# Patient Record
Sex: Male | Born: 1960 | Race: White | Hispanic: No | Smoking: Never smoker
Health system: Southern US, Community
[De-identification: ages and names within clinical notes are randomized; demographics above are authoritative.]

## PROBLEM LIST (undated history)

## (undated) DIAGNOSIS — E785 Hyperlipidemia, unspecified: Secondary | ICD-10-CM

## (undated) DIAGNOSIS — Z87442 Personal history of urinary calculi: Secondary | ICD-10-CM

## (undated) DIAGNOSIS — T39395A Adverse effect of other nonsteroidal anti-inflammatory drugs [NSAID], initial encounter: Secondary | ICD-10-CM

## (undated) DIAGNOSIS — R002 Palpitations: Secondary | ICD-10-CM

## (undated) DIAGNOSIS — IMO0002 Reserved for concepts with insufficient information to code with codable children: Secondary | ICD-10-CM

## (undated) DIAGNOSIS — M545 Low back pain, unspecified: Secondary | ICD-10-CM

## (undated) DIAGNOSIS — I10 Essential (primary) hypertension: Secondary | ICD-10-CM

## (undated) DIAGNOSIS — J45909 Unspecified asthma, uncomplicated: Secondary | ICD-10-CM

## (undated) DIAGNOSIS — Q059 Spina bifida, unspecified: Secondary | ICD-10-CM

## (undated) DIAGNOSIS — G4733 Obstructive sleep apnea (adult) (pediatric): Secondary | ICD-10-CM

## (undated) DIAGNOSIS — J189 Pneumonia, unspecified organism: Secondary | ICD-10-CM

## (undated) DIAGNOSIS — I4510 Unspecified right bundle-branch block: Secondary | ICD-10-CM

## (undated) DIAGNOSIS — K922 Gastrointestinal hemorrhage, unspecified: Secondary | ICD-10-CM

## (undated) DIAGNOSIS — E669 Obesity, unspecified: Secondary | ICD-10-CM

## (undated) DIAGNOSIS — R0602 Shortness of breath: Secondary | ICD-10-CM

## (undated) DIAGNOSIS — K254 Chronic or unspecified gastric ulcer with hemorrhage: Secondary | ICD-10-CM

## (undated) DIAGNOSIS — R9431 Abnormal electrocardiogram [ECG] [EKG]: Secondary | ICD-10-CM

## (undated) DIAGNOSIS — Z0189 Encounter for other specified special examinations: Secondary | ICD-10-CM

## (undated) HISTORY — PX: EXTRACORPOREAL SHOCK WAVE LITHOTRIPSY: SHX1557

## (undated) HISTORY — DX: Encounter for other specified special examinations: Z01.89

## (undated) HISTORY — PX: EYE SURGERY: SHX253

## (undated) HISTORY — DX: Shortness of breath: R06.02

## (undated) HISTORY — DX: Palpitations: R00.2

## (undated) HISTORY — DX: Unspecified asthma, uncomplicated: J45.909

## (undated) HISTORY — DX: Chronic or unspecified gastric ulcer with hemorrhage: K25.4

## (undated) HISTORY — DX: Unspecified right bundle-branch block: I45.10

## (undated) HISTORY — DX: Hyperlipidemia, unspecified: E78.5

---

## 1983-09-20 HISTORY — PX: HERNIA REPAIR: SHX51

## 1997-09-25 HISTORY — PX: HERNIA REPAIR: SHX51

## 1999-04-12 ENCOUNTER — Encounter: Payer: Self-pay | Admitting: *Deleted

## 1999-04-12 ENCOUNTER — Ambulatory Visit (HOSPITAL_COMMUNITY): Admission: RE | Admit: 1999-04-12 | Discharge: 1999-04-12 | Payer: Self-pay | Admitting: Pediatrics

## 1999-05-25 ENCOUNTER — Ambulatory Visit (HOSPITAL_COMMUNITY): Admission: RE | Admit: 1999-05-25 | Discharge: 1999-05-25 | Payer: Self-pay | Admitting: *Deleted

## 1999-05-25 ENCOUNTER — Encounter: Payer: Self-pay | Admitting: *Deleted

## 2001-08-08 ENCOUNTER — Encounter: Admission: RE | Admit: 2001-08-08 | Discharge: 2001-08-08 | Payer: Self-pay | Admitting: Pediatrics

## 2001-08-08 ENCOUNTER — Encounter: Payer: Self-pay | Admitting: *Deleted

## 2001-08-12 ENCOUNTER — Encounter: Payer: Self-pay | Admitting: *Deleted

## 2001-08-12 ENCOUNTER — Encounter: Admission: RE | Admit: 2001-08-12 | Discharge: 2001-08-12 | Payer: Self-pay | Admitting: *Deleted

## 2001-08-29 ENCOUNTER — Encounter: Payer: Self-pay | Admitting: *Deleted

## 2001-08-29 ENCOUNTER — Encounter: Admission: RE | Admit: 2001-08-29 | Discharge: 2001-08-29 | Payer: Self-pay | Admitting: *Deleted

## 2002-09-09 ENCOUNTER — Ambulatory Visit (HOSPITAL_COMMUNITY): Admission: RE | Admit: 2002-09-09 | Discharge: 2002-09-09 | Payer: Self-pay | Admitting: Pediatrics

## 2002-09-09 ENCOUNTER — Encounter: Admission: RE | Admit: 2002-09-09 | Discharge: 2002-09-09 | Payer: Self-pay | Admitting: *Deleted

## 2002-09-09 ENCOUNTER — Encounter: Payer: Self-pay | Admitting: *Deleted

## 2002-09-11 ENCOUNTER — Ambulatory Visit (HOSPITAL_BASED_OUTPATIENT_CLINIC_OR_DEPARTMENT_OTHER): Admission: RE | Admit: 2002-09-11 | Discharge: 2002-09-11 | Payer: Self-pay | Admitting: *Deleted

## 2002-09-11 ENCOUNTER — Encounter: Payer: Self-pay | Admitting: *Deleted

## 2002-10-23 ENCOUNTER — Ambulatory Visit (HOSPITAL_BASED_OUTPATIENT_CLINIC_OR_DEPARTMENT_OTHER): Admission: RE | Admit: 2002-10-23 | Discharge: 2002-10-23 | Payer: Self-pay | Admitting: *Deleted

## 2002-10-23 ENCOUNTER — Encounter: Payer: Self-pay | Admitting: *Deleted

## 2002-11-03 ENCOUNTER — Encounter: Payer: Self-pay | Admitting: *Deleted

## 2002-11-03 ENCOUNTER — Encounter: Admission: RE | Admit: 2002-11-03 | Discharge: 2002-11-03 | Payer: Self-pay | Admitting: *Deleted

## 2005-10-03 ENCOUNTER — Encounter: Admission: RE | Admit: 2005-10-03 | Discharge: 2005-10-03 | Payer: Self-pay | Admitting: Specialist

## 2007-02-20 ENCOUNTER — Ambulatory Visit: Payer: Self-pay | Admitting: Urology

## 2007-02-21 ENCOUNTER — Ambulatory Visit: Payer: Self-pay | Admitting: Urology

## 2007-02-28 ENCOUNTER — Ambulatory Visit: Payer: Self-pay | Admitting: Urology

## 2007-03-07 ENCOUNTER — Ambulatory Visit: Payer: Self-pay | Admitting: Urology

## 2007-03-12 ENCOUNTER — Ambulatory Visit: Payer: Self-pay | Admitting: Urology

## 2007-08-05 ENCOUNTER — Ambulatory Visit: Payer: Self-pay | Admitting: Urology

## 2007-08-29 ENCOUNTER — Ambulatory Visit: Payer: Self-pay | Admitting: Urology

## 2007-09-05 ENCOUNTER — Ambulatory Visit: Payer: Self-pay | Admitting: Urology

## 2007-09-18 ENCOUNTER — Ambulatory Visit: Payer: Self-pay | Admitting: Urology

## 2007-11-18 ENCOUNTER — Ambulatory Visit: Payer: Self-pay | Admitting: Urology

## 2008-10-12 ENCOUNTER — Ambulatory Visit: Payer: Self-pay | Admitting: Urology

## 2009-01-14 IMAGING — CR DG ABDOMEN 1V
1 series · 2 of 2 positions shown · non-contrast
Comparison: none

REASON FOR EXAM: renal calculi-lithotripsy
COMMENTS:

[Series 1: view not recorded · 0.17mm/px · 2 of 2 slices shown]
[im 1/2]
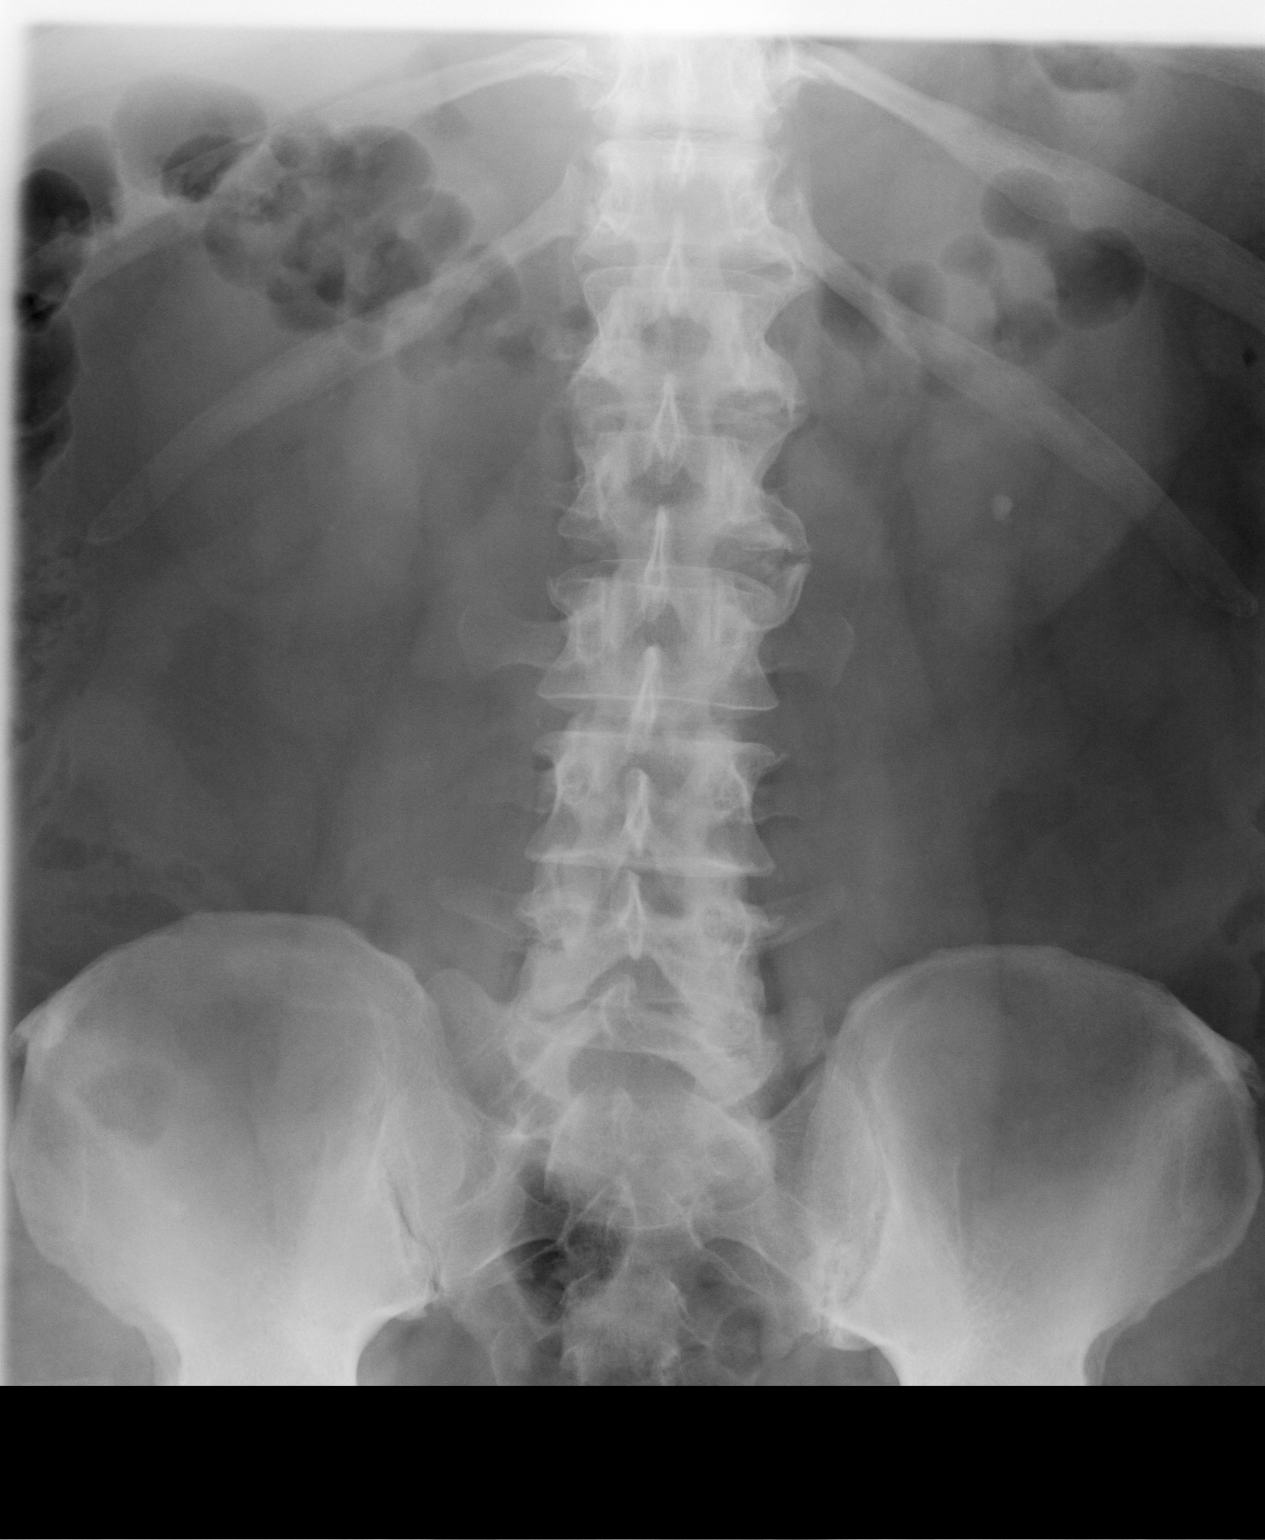
[im 2/2]
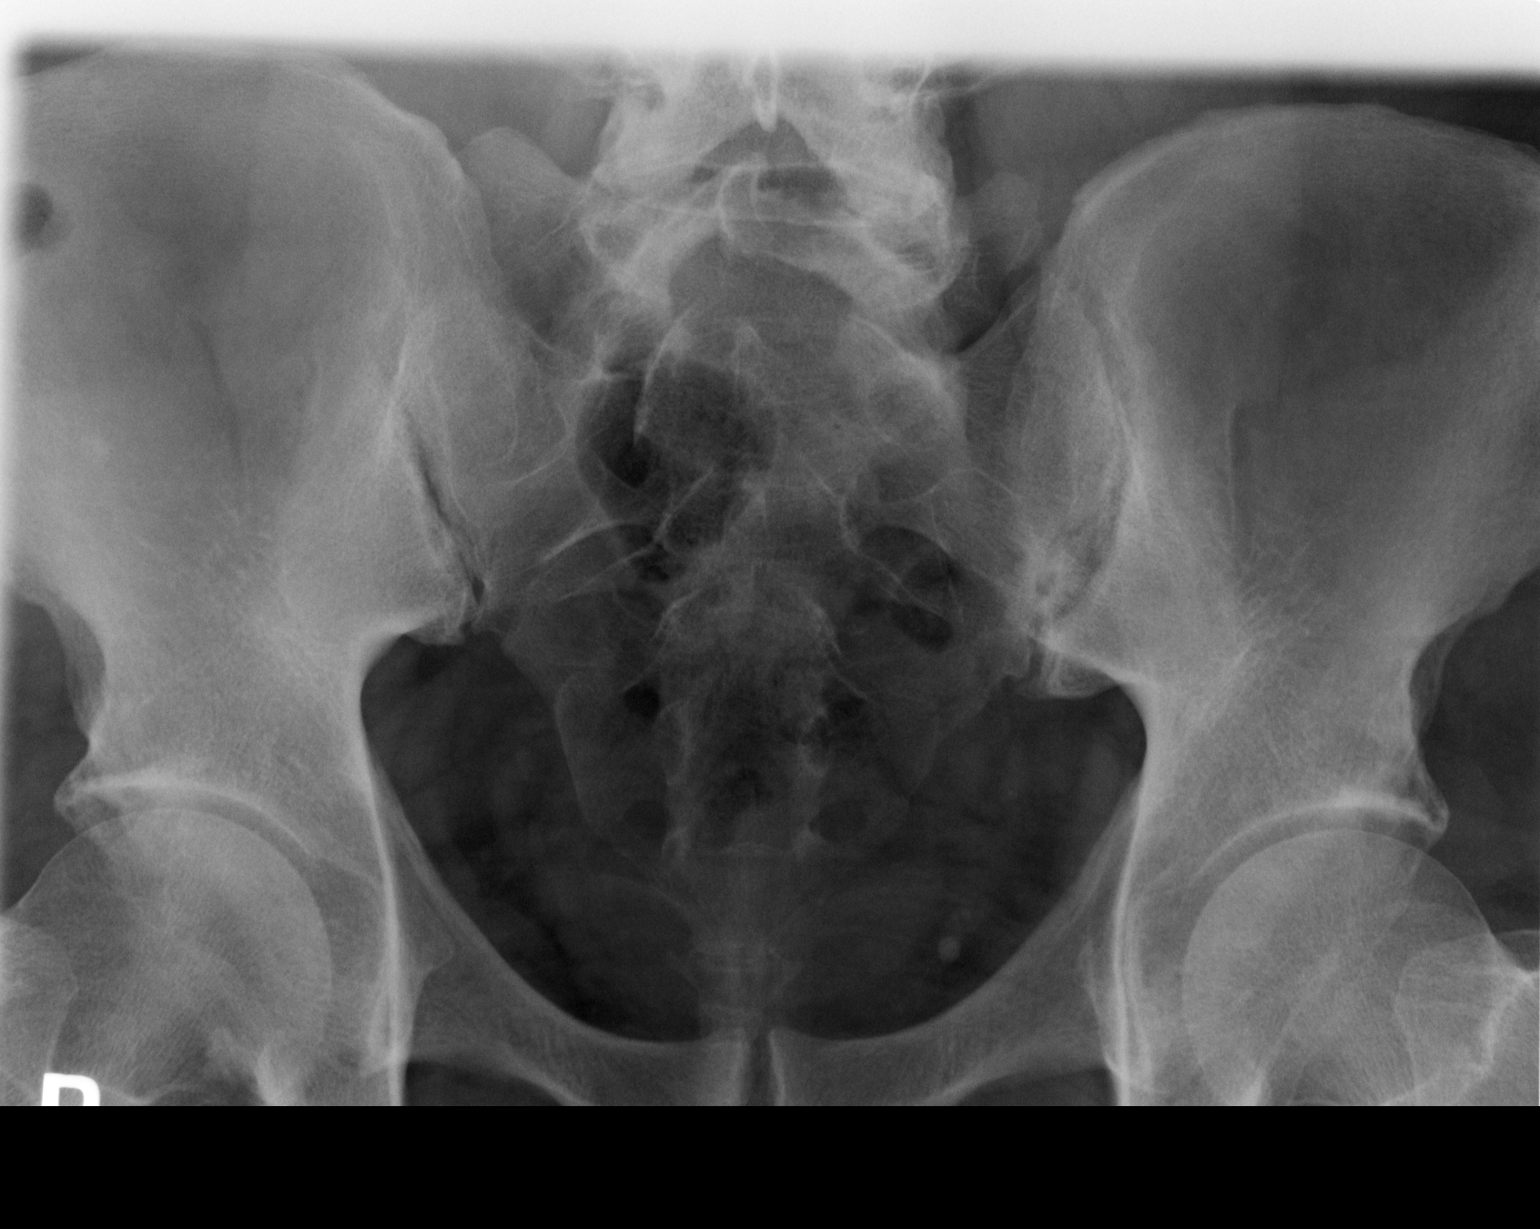

[2 of 2 positions shown; findings below may reference images not displayed]

PROCEDURE:     DXR - DXR KIDNEY URETER BLADDER  - September 05, 2007  [DATE]

RESULT:     An AP view of the abdomen shows an 8 mm calcification projected
over the lower pole of the LEFT kidney. The tiny RIGHT renal stones noted at
prior CT are not definitely identified on plain film examination. No
definite ureteral stones are seen.
IMPRESSION: 1.     Please see above.

## 2009-07-22 ENCOUNTER — Ambulatory Visit: Payer: Self-pay | Admitting: Urology

## 2009-07-27 ENCOUNTER — Ambulatory Visit: Payer: Self-pay | Admitting: Urology

## 2010-08-10 ENCOUNTER — Encounter: Admission: RE | Admit: 2010-08-10 | Discharge: 2010-08-10 | Payer: Self-pay | Admitting: Orthopedic Surgery

## 2011-01-20 HISTORY — PX: KNEE ARTHROSCOPY: SHX127

## 2011-05-05 NOTE — H&P (Signed)
NAME:  Leslie Soto, Leslie Soto NO.:  [id number]   MEDICAL RECORD NO.:  [id number]                   PATIENT TYPE:  AMB   LOCATION:  [facility]                                 FACILITY:  [facility]   PHYSICIAN:  Ethelene Browns., M.D.            DATE OF BIRTH:  [birth year]   DATE OF ADMISSION:  10/23/2002  DATE OF DISCHARGE:                                HISTORY & PHYSICAL   INTRODUCTION:  This 50 year old male is scheduled for a left renal ESWL on  October 23, 2002, [facility], 3:30 p.m., with a chief complaint  of pain in the left side.   HISTORY OF PRESENT ILLNESS:  This 50 year old male has had renal calculi  bilaterally since 1977/05/24, onset age 28.  In 05/24/88, he had left ESWL, and he  claims that he has passed over 30 calculi over the years.  He does have  several documented calculi, the last of which was a left ureteral calculus  that was passed September 12, 2002.  He has been evaluated for parathyroid  disease, and his calciums and parathyroid hormone have been normal.  He did  have some blood in the urine when he passed the stone in September.  He gets  up twice a night and voids every couple of hours.  He has had a KUB, which  showed a stone about 10 mm in the left lower calix, and he would like to  have this fragmented in an attempt to improve the discomfort in his left  side.   ALLERGIES:  No known drug allergies.   CURRENT MEDICATIONS:  1. Hyzaar 12.5 mg daily.  2. Percocet p.r.n. for pain.   PAST MEDICAL HISTORY:  1. Eye surgery in 05/24/1968.  2. Hernia repair in 1987 and 1986.  3. ESWL on the left in 24-May-1988.  4. He has passed numerous stones.  5. Treated for hypertension.   FAMILY HISTORY:  Positive for heart disease, kidney disease, high blood  pressure, diabetes, and stones.   SOCIAL HISTORY:  The patient is single.  He does not abuse alcohol or  tobacco.  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   REVIEW OF SYSTEMS:  General  health has been good.  Weight has been stable.  HEENT:  Unremarkable.  CARDIORESPIRATORY:  He denies any chest pain or heart  attack.  GASTROINTESTINAL:  No peptic ulcer disease.  No hepatitis.  He has  had some hemorrhoid problems.  No recent bleeding.  BONES, JOINTS, MUSCLES:  He denies arthritis.  NEUROPSYCHIATRIC:  He denies any strokes or fainting  spells.  SKIN:  No lesions.  HEMATOPOIETIC AND LYMPHATIC:  No nodes or  anemia.   PHYSICAL EXAMINATION:  GENERAL:  Well-developed, well-nourished 50 year old  male.  VITAL SIGNS:  Temperature 97, pulse 70, respirations 17, blood pressure  168/104.  HEENT:  The ears and tympanic membranes are unremarkable.  Eyes  react  normally to light and accommodation.  Extraocular muscles intact.  Pharynx  benign.  Teeth in good repair.  NECK:  No enlargement of thyroid.  No nodes palpable.  CHEST:  Clear to percussion and auscultation.  HEART:  Normal sinus rhythm.  No murmur.  ABDOMEN:  Moderately obese.  There is a small umbilical scar, right inguinal  scar.  Liver, kidney, spleen, masses, tenderness, hernia are not detected.  GENITOURINARY:  External genitalia and meatus are normal.  The penis is  circumcised.  Testes good size, symmetrical.  RECTAL:  Scrotum, anus, perineum, and rectal tone good.  Prostate estimated  20-25 g, smooth, soft, symmetrical, nontender.  EXTREMITIES:  No edema.  Good pulses.  NEUROLOGIC:  Grossly normal reflexes and sensation.   IMPRESSION:  1. Left renal calcification, lower calix, 10 mm.  2. Bilateral renal calculi dating to 1978, with extracorporeal shockwave     lithotripsy on the left in 1989, and over 30 calculi passed by history.  3. Hypertension.   PLAN:  We will proceed with ESWL on the left on October 23, 2002.                                                Ethelene Browns., M.D.    HB/MEDQ  D:  10/22/2002  T:  10/22/2002  Job:  [id number]

## 2011-05-05 NOTE — H&P (Signed)
NAME:  Leslie Soto, Leslie Soto NO.:  [id number]   MEDICAL RECORD NO.:  [id number]                    PATIENT TYPE:   LOCATION:                                       FACILITY:   PHYSICIAN:  Lynnea Ferrier., M.D.         DATE OF BIRTH:  08-26-1961   DATE OF ADMISSION:  DATE OF DISCHARGE:                                HISTORY & PHYSICAL   REASON FOR ADMISSION:  This 50 year old male is scheduled on September 11, 2002, for ESWL of a left ureteral calculus L2-3 level.   CHIEF COMPLAINT:  Pain in the left side.   HISTORY OF PRESENT ILLNESS:  This 50 year old male has had renal calculi  bilaterally since 1978, onset age 40.  In 1989, he had left ESWL and he  claims that he has passed over 30 calculi over the years.  He does have  several documented calculi, the last of which was a right ureteral calculus  passed in April of 2003 and it was calcium oxylate monohydrate on analysis.  The patient has also had parathyroid and calcium phosphorus determinations  which have been within normal limits November 2002.  He has had some blood  in the urine.  He has had pain for two weeks.  He gets up twice a night,  voids every two hours.  He had a KUB on September 04, 2002, that showed a  stone at the L2-3 level on the left.  He also has a 10 mm calculus in the  left lower calix.  The one in the ureter measures about 3 x 5 mm.  In view  of the persistent pain, the patient was advised to have shockwave  lithotripsy, advised to stop his aspirin on September 04, 2002, scheduled  for September 11, 2002.   ALLERGIES:  No known drug allergies.   CURRENT MEDICATIONS:  1. Hyzaar 12.5 mg daily.  2. He has Percocet if he needs it for pain.   PAST MEDICAL HISTORY:  He has had eye surgery in 1969.  He had hernia  surgery in 1987 and 1986.  He had ESWL on the left as noted on in 1989.  He  has also been treated for hypertension.   FAMILY HISTORY:  This is positive for heart  disease, kidney disease, high  blood pressure, diabetes and stones.   SOCIAL HISTORY:  The patient is single.  He does not abuse alcohol or  tobacco.  He is employed.   REVIEW OF SYSTEMS:  GENERAL:  Health has been good.  Weight has been stable.  HEENT:        Unremarkable.  CARDIORESPIRATORY:  Denies any chest pain or  heart attack.  GI:  No peptic ulcer disease, no hepatitis.  He has had some  hemorrhoid problems.  BONES, JOINTS, MUSCLES:  He denies arthritis.  NEUROPSYCHIATRIC:  He denies any strokes.  SKIN:  No skin lesions.  HEMATOPOIETIC AND LYMPHATIC:  No nodes or anemia.   PHYSICAL EXAMINATION:  GENERAL APPEARANCE:  A well-developed, well-nourished  50 year old male with discomfort in the left side.  VITAL SIGNS:  Temperature 98.0, pulse 67, respiratory rate 18, blood  pressure 151/98.  Weight is 220.  HEENT:  The ears and tympanic membranes are unremarkable.  Eyes react to  light and  accommodation.  Extraocular movements intact.  Pharynx benign.  Teeth in good repair.  NECK:  No enlargement of thyroid or nodes palpable.  CHEST:  Clear to percussion and auscultation.  CARDIOVASCULAR:  Normal sinus rhythm with no murmur.  ABDOMEN:  The abdomen is moderately obese.  He had a small umbilical scar,  right inguinal scar.  Liver, kidney, spleen, masses, tenderness, hernia not  demonstrated today.  GU:  External genitalia and meatus are normal.  Penis is circumcised.  Testes are good size and symmetrical.  Scrotum, anus, perineum normal.  Rectal tone good.  Prostate is estimated at 20 to 25 g, smooth, soft,  symmetrical, nontender.  EXTREMITIES:  No edema.  Good peripheral pulses.  NEUROLOGIC:  Grossly normal reflexes and sensation.   IMPRESSION:  1. Left ureteral calculus 3 x 5 mm at L2-3 level.  2. Bilateral renal calculi dating to 10.  3. Hypertension.  4. History of extracorporeal shock wave lithotripsy in 1989 left.   PLAN:  We will proceed with ESWL on the left on  September 11, 2002, if his  stone does not pass.                                                  Lynnea Ferrier., M.D.    Willia Craze  D:  09/04/2002  T:  09/04/2002  Job:  [id number]

## 2012-02-01 ENCOUNTER — Ambulatory Visit: Payer: Self-pay | Admitting: Urology

## 2012-02-08 ENCOUNTER — Ambulatory Visit: Payer: Self-pay | Admitting: Urology

## 2012-02-22 ENCOUNTER — Ambulatory Visit: Payer: Self-pay | Admitting: Urology

## 2012-06-12 HISTORY — PX: CARDIOVASCULAR STRESS TEST: SHX262

## 2012-07-25 ENCOUNTER — Ambulatory Visit: Payer: Self-pay | Admitting: Urology

## 2012-07-29 ENCOUNTER — Ambulatory Visit: Payer: Self-pay | Admitting: Urology

## 2012-08-26 ENCOUNTER — Encounter (HOSPITAL_COMMUNITY): Payer: Self-pay | Admitting: *Deleted

## 2012-08-26 DIAGNOSIS — IMO0002 Reserved for concepts with insufficient information to code with codable children: Secondary | ICD-10-CM | POA: Diagnosis present

## 2012-08-26 DIAGNOSIS — I498 Other specified cardiac arrhythmias: Secondary | ICD-10-CM | POA: Diagnosis present

## 2012-08-26 DIAGNOSIS — I10 Essential (primary) hypertension: Secondary | ICD-10-CM | POA: Diagnosis present

## 2012-08-26 DIAGNOSIS — M5137 Other intervertebral disc degeneration, lumbosacral region: Secondary | ICD-10-CM | POA: Diagnosis present

## 2012-08-26 DIAGNOSIS — M1710 Unilateral primary osteoarthritis, unspecified knee: Secondary | ICD-10-CM | POA: Diagnosis present

## 2012-08-26 DIAGNOSIS — D72829 Elevated white blood cell count, unspecified: Secondary | ICD-10-CM | POA: Diagnosis present

## 2012-08-26 DIAGNOSIS — T3995XA Adverse effect of unspecified nonopioid analgesic, antipyretic and antirheumatic, initial encounter: Secondary | ICD-10-CM | POA: Diagnosis present

## 2012-08-26 DIAGNOSIS — M51379 Other intervertebral disc degeneration, lumbosacral region without mention of lumbar back pain or lower extremity pain: Secondary | ICD-10-CM | POA: Diagnosis present

## 2012-08-26 DIAGNOSIS — G4733 Obstructive sleep apnea (adult) (pediatric): Secondary | ICD-10-CM | POA: Diagnosis present

## 2012-08-26 DIAGNOSIS — E669 Obesity, unspecified: Secondary | ICD-10-CM | POA: Diagnosis present

## 2012-08-26 DIAGNOSIS — M545 Low back pain, unspecified: Secondary | ICD-10-CM | POA: Diagnosis present

## 2012-08-26 DIAGNOSIS — K254 Chronic or unspecified gastric ulcer with hemorrhage: Principal | ICD-10-CM | POA: Diagnosis present

## 2012-08-26 LAB — URINALYSIS, ROUTINE W REFLEX MICROSCOPIC
Bilirubin Urine: NEGATIVE
Glucose, UA: NEGATIVE mg/dL
Hgb urine dipstick: NEGATIVE
Ketones, ur: 15 mg/dL — AB
Leukocytes, UA: NEGATIVE
Protein, ur: NEGATIVE mg/dL
Specific Gravity, Urine: 1.027 (ref 1.005–1.030)
Urobilinogen, UA: 0.2 mg/dL (ref 0.0–1.0)
pH: 7 (ref 5.0–8.0)

## 2012-08-26 LAB — COMPREHENSIVE METABOLIC PANEL
ALT: 19 U/L (ref 0–53)
Albumin: 3.6 g/dL (ref 3.5–5.2)
Alkaline Phosphatase: 74 U/L (ref 39–117)
BUN: 36 mg/dL — ABNORMAL HIGH (ref 6–23)
Calcium: 9.8 mg/dL (ref 8.4–10.5)
Chloride: 109 mEq/L (ref 96–112)
Creatinine, Ser: 0.77 mg/dL (ref 0.50–1.35)
GFR calc non Af Amer: >90 mL/min (ref >90)
Sodium: 141 mEq/L (ref 135–145)
Total Bilirubin: 0.7 mg/dL (ref 0.3–1.2)

## 2012-08-26 LAB — CBC WITH DIFFERENTIAL/PLATELET
Basophils Absolute: 0 10*3/uL (ref 0.0–0.1)
Basophils Relative: 0 % (ref 0–1)
Eosinophils Relative: 1 % (ref 0–5)
HCT: 40.1 % (ref 39.0–52.0)
Hemoglobin: 13.7 g/dL (ref 13.0–17.0)
Lymphs Abs: 2.7 10*3/uL (ref 0.7–4.0)
MCH: 31.4 pg (ref 26.0–34.0)
MCHC: 34.2 g/dL (ref 30.0–36.0)
MCV: 91.8 fL (ref 78.0–100.0)
Monocytes Absolute: 1.1 10*3/uL — ABNORMAL HIGH (ref 0.1–1.0)
Monocytes Relative: 7 % (ref 3–12)
Neutro Abs: 10.5 10*3/uL — ABNORMAL HIGH (ref 1.7–7.7)
Neutrophils Relative %: 73 % (ref 43–77)
Platelets: 233 10*3/uL (ref 150–400)
RBC: 4.37 MIL/uL (ref 4.22–5.81)
RDW: 14.5 % (ref 11.5–15.5)
WBC: 14.4 10*3/uL — ABNORMAL HIGH (ref 4.0–10.5)

## 2012-08-26 LAB — SAMPLE TO BLOOD BANK

## 2012-08-26 NOTE — ED Notes (Signed)
BM visualized in commode: no obvious blood, appears soft, loose, no form visualized, black & brown.

## 2012-08-26 NOTE — ED Notes (Signed)
Black stools since this am with frequent stools and with each stool he gets dizzy. nauseated

## 2012-08-26 NOTE — ED Notes (Addendum)
Pt pleasantly sarcastic, playful, joking and minimizing of sx, being polite & chivalrous in triage area, difficult to ascertain pt's sx severity. Ambulatory to b/r.

## 2012-08-27 ENCOUNTER — Encounter (HOSPITAL_COMMUNITY): Payer: Self-pay | Admitting: Family Medicine

## 2012-08-27 ENCOUNTER — Inpatient Hospital Stay (HOSPITAL_COMMUNITY)
Admission: EM | Admit: 2012-08-27 | Discharge: 2012-08-29 | DRG: 175 | Disposition: A | Discharge status: Home / Self Care | Payer: BC Managed Care – PPO | Attending: Internal Medicine | Admitting: Internal Medicine

## 2012-08-27 ENCOUNTER — Emergency Department (HOSPITAL_COMMUNITY): Payer: BC Managed Care – PPO

## 2012-08-27 DIAGNOSIS — M545 Low back pain, unspecified: Secondary | ICD-10-CM | POA: Diagnosis present

## 2012-08-27 DIAGNOSIS — IMO0002 Reserved for concepts with insufficient information to code with codable children: Secondary | ICD-10-CM | POA: Diagnosis present

## 2012-08-27 DIAGNOSIS — K254 Chronic or unspecified gastric ulcer with hemorrhage: Secondary | ICD-10-CM

## 2012-08-27 DIAGNOSIS — G4733 Obstructive sleep apnea (adult) (pediatric): Secondary | ICD-10-CM | POA: Diagnosis present

## 2012-08-27 DIAGNOSIS — T39395A Adverse effect of other nonsteroidal anti-inflammatory drugs [NSAID], initial encounter: Secondary | ICD-10-CM | POA: Diagnosis present

## 2012-08-27 DIAGNOSIS — K921 Melena: Secondary | ICD-10-CM | POA: Diagnosis present

## 2012-08-27 DIAGNOSIS — R000 Tachycardia, unspecified: Secondary | ICD-10-CM | POA: Diagnosis present

## 2012-08-27 DIAGNOSIS — T887XXA Unspecified adverse effect of drug or medicament, initial encounter: Secondary | ICD-10-CM

## 2012-08-27 DIAGNOSIS — D62 Acute posthemorrhagic anemia: Secondary | ICD-10-CM | POA: Diagnosis present

## 2012-08-27 DIAGNOSIS — K922 Gastrointestinal hemorrhage, unspecified: Secondary | ICD-10-CM | POA: Diagnosis present

## 2012-08-27 DIAGNOSIS — E669 Obesity, unspecified: Secondary | ICD-10-CM | POA: Diagnosis present

## 2012-08-27 DIAGNOSIS — I10 Essential (primary) hypertension: Secondary | ICD-10-CM | POA: Diagnosis present

## 2012-08-27 DIAGNOSIS — D72829 Elevated white blood cell count, unspecified: Secondary | ICD-10-CM | POA: Diagnosis present

## 2012-08-27 HISTORY — DX: Low back pain, unspecified: M54.50

## 2012-08-27 HISTORY — DX: Adverse effect of other nonsteroidal anti-inflammatory drugs (NSAID), initial encounter: T39.395A

## 2012-08-27 HISTORY — DX: Reserved for concepts with insufficient information to code with codable children: IMO0002

## 2012-08-27 HISTORY — DX: Obesity, unspecified: E66.9

## 2012-08-27 HISTORY — DX: Gastrointestinal hemorrhage, unspecified: K92.2

## 2012-08-27 HISTORY — DX: Essential (primary) hypertension: I10

## 2012-08-27 HISTORY — DX: Spina bifida, unspecified: Q05.9

## 2012-08-27 HISTORY — DX: Obstructive sleep apnea (adult) (pediatric): G47.33

## 2012-08-27 HISTORY — DX: Low back pain: M54.5

## 2012-08-27 LAB — CBC
HCT: 34.2 % — ABNORMAL LOW (ref 39.0–52.0)
Hemoglobin: 11.8 g/dL — ABNORMAL LOW (ref 13.0–17.0)
MCH: 31.2 pg (ref 26.0–34.0)
MCHC: 34.5 g/dL (ref 30.0–36.0)
MCV: 90.5 fL (ref 78.0–100.0)
MCV: 91.4 fL (ref 78.0–100.0)
Platelets: 217 K/uL (ref 150–400)
Platelets: 178 10*3/uL (ref 150–400)
RBC: 3.78 MIL/uL — ABNORMAL LOW (ref 4.22–5.81)
RBC: 3.26 MIL/uL — ABNORMAL LOW (ref 4.22–5.81)
RDW: 14.5 % (ref 11.5–15.5)
WBC: 14.7 K/uL — ABNORMAL HIGH (ref 4.0–10.5)
WBC: 10.9 10*3/uL — ABNORMAL HIGH (ref 4.0–10.5)

## 2012-08-27 LAB — TYPE AND SCREEN
ABO/RH(D): O POS
Antibody Screen: NEGATIVE

## 2012-08-27 LAB — OCCULT BLOOD, POC DEVICE: Fecal Occult Bld: POSITIVE

## 2012-08-27 LAB — ABO/RH: ABO/RH(D): O POS

## 2012-08-27 LAB — HEMOGLOBIN AND HEMATOCRIT, BLOOD: HCT: 32.5 % — ABNORMAL LOW (ref 39.0–52.0)

## 2012-08-27 MED ORDER — SODIUM CHLORIDE 0.9 % IJ SOLN
3.0000 mL | Freq: Two times a day (BID) | INTRAMUSCULAR | Status: DC
  Administered 2012-08-27 – 2012-08-29 (×3): 3 mL via INTRAVENOUS

## 2012-08-27 MED ORDER — HYDRALAZINE HCL 20 MG/ML IJ SOLN
5.0000 mg | Freq: Four times a day (QID) | INTRAMUSCULAR | Status: DC | PRN
  Filled 2012-08-27: qty 0.25

## 2012-08-27 MED ORDER — SODIUM CHLORIDE 0.9 % IV BOLUS (SEPSIS)
1000.0000 mL | Freq: Once | INTRAVENOUS | Status: AC
Start: 2012-08-27 — End: 2012-08-27
  Administered 2012-08-27: 1000 mL via INTRAVENOUS

## 2012-08-27 MED ORDER — SODIUM CHLORIDE 0.9 % IV SOLN
8.0000 mg/h | Freq: Once | INTRAVENOUS | Status: AC
  Administered 2012-08-27: 8 mg/h via INTRAVENOUS
  Filled 2012-08-27: qty 80

## 2012-08-27 MED ORDER — PANTOPRAZOLE SODIUM 40 MG IV SOLR
40.0000 mg | Freq: Two times a day (BID) | INTRAVENOUS | Status: DC
  Administered 2012-08-27 – 2012-08-29 (×4): 40 mg via INTRAVENOUS
  Filled 2012-08-27 (×6): qty 40

## 2012-08-27 MED ORDER — SODIUM CHLORIDE 0.9 % IV SOLN
INTRAVENOUS | Status: DC
  Administered 2012-08-27 – 2012-08-28 (×5): via INTRAVENOUS

## 2012-08-27 MED ORDER — PANTOPRAZOLE SODIUM 40 MG IV SOLR
40.0000 mg | Freq: Once | INTRAVENOUS | Status: AC
  Administered 2012-08-27: 40 mg via INTRAVENOUS
  Filled 2012-08-27: qty 40

## 2012-08-27 MED ORDER — MORPHINE SULFATE 2 MG/ML IJ SOLN: 1.0000 mg | INTRAMUSCULAR | Status: DC | PRN

## 2012-08-27 NOTE — ED Provider Notes (Signed)
History     CSN: [id number]  Arrival date & time 08/26/12  1929   First MD Initiated Contact with Patient 08/27/12 0109      Chief Complaint  Patient presents with  . black stools     (Consider location/radiation/quality/duration/timing/severity/associated sxs/prior treatment) HPI HX per PT.  takes NSAIDs and ASA daily for chronic back pain and tonight noticed black stools, no BRBPR, no ABD pain or N/V/D. Has not taken BP meds in 3 days. Mod in severity, no h/o same. No blood thinners otherwise. No etoh. Multiple black stools tonight. No diarrhea.  Past Medical History  Diagnosis Date  . Hypertension     History reviewed. No pertinent past surgical history.  No family history on file.  History  Substance Use Topics  . Smoking status: Never Smoker   . Smokeless tobacco: Not on file  . Alcohol Use: No      Review of Systems  Constitutional: Negative for fever and chills.  HENT: Negative for neck pain and neck stiffness.   Eyes: Negative for pain.  Respiratory: Negative for shortness of breath.   Cardiovascular: Negative for chest pain.  Gastrointestinal: Negative for vomiting, abdominal pain and rectal pain.  Genitourinary: Negative for dysuria.  Musculoskeletal: Negative for back pain.  Skin: Negative for rash.  Neurological: Negative for headaches.  All other systems reviewed and are negative.    Allergies  Review of patient's allergies indicates no known allergies.  Home Medications   Current Outpatient Rx  Name Route Sig Dispense Refill  . AMLODIPINE BESYLATE PO Oral Take 1 tablet by mouth 2 (two) times daily.    Marland Kitchen METOPROLOL TARTRATE PO Oral Take 1 tablet by mouth every evening.    Marland Kitchen BENICAR PO Oral Take 1 tablet by mouth daily.      BP 126/83  Pulse 124  Temp 99.2 F (37.3 C) (Oral)  Resp 17  SpO2 100%  Physical Exam  Constitutional: He is oriented to person, place, and time. He appears well-developed and well-nourished.  HENT:  Head:  Normocephalic and atraumatic.  Eyes: Conjunctivae and EOM are normal. Pupils are equal, round, and reactive to light.  Neck: Trachea normal. Neck supple. No thyromegaly present.  Cardiovascular: Regular rhythm, S1 normal, S2 normal and normal pulses.     No systolic murmur is present   No diastolic murmur is present  Pulses:      Radial pulses are 2+ on the right side, and 2+ on the left side.       Tachy   Pulmonary/Chest: Effort normal and breath sounds normal. He has no wheezes. He has no rhonchi. He has no rales. He exhibits no tenderness.  Abdominal: Soft. Normal appearance and bowel sounds are normal. There is no tenderness. There is no CVA tenderness and negative Murphy's sign.  Genitourinary:       nontender rectal exam, black stool  Musculoskeletal: He exhibits no edema and no tenderness.       MAE x 4  Neurological: He is alert and oriented to person, place, and time. He has normal strength. No cranial nerve deficit or sensory deficit. GCS eye subscore is 4. GCS verbal subscore is 5. GCS motor subscore is 6.  Skin: Skin is warm and dry. No rash noted. He is not diaphoretic.  Psychiatric: His speech is normal.       Cooperative and appropriate    ED Course  Procedures (including critical care time)  Results for orders placed during the hospital encounter of  08/27/12  URINALYSIS, ROUTINE W REFLEX MICROSCOPIC      Component Value Range   Color, Urine YELLOW  YELLOW   APPearance HAZY (*) CLEAR   Specific Gravity, Urine 1.027  1.005 - 1.030   pH 7.0  5.0 - 8.0   Glucose, UA NEGATIVE  NEGATIVE mg/dL   Hgb urine dipstick NEGATIVE  NEGATIVE   Bilirubin Urine NEGATIVE  NEGATIVE   Ketones, ur 15 (*) NEGATIVE mg/dL   Protein, ur NEGATIVE  NEGATIVE mg/dL   Urobilinogen, UA 0.2  0.0 - 1.0 mg/dL   Nitrite NEGATIVE  NEGATIVE   Leukocytes, UA NEGATIVE  NEGATIVE  CBC WITH DIFFERENTIAL      Component Value Range   WBC 14.4 (*) 4.0 - 10.5 K/uL   RBC 4.37  4.22 - 5.81 MIL/uL    Hemoglobin 13.7  13.0 - 17.0 g/dL   HCT 96.0  45.4 - 09.8 %   MCV 91.8  78.0 - 100.0 fL   MCH 31.4  26.0 - 34.0 pg   MCHC 34.2  30.0 - 36.0 g/dL   RDW 11.9  14.7 - 82.9 %   Platelets 233  150 - 400 K/uL   Neutrophils Relative 73  43 - 77 %   Neutro Abs 10.5 (*) 1.7 - 7.7 K/uL   Lymphocytes Relative 19  12 - 46 %   Lymphs Abs 2.7  0.7 - 4.0 K/uL   Monocytes Relative 7  3 - 12 %   Monocytes Absolute 1.1 (*) 0.1 - 1.0 K/uL   Eosinophils Relative 1  0 - 5 %   Eosinophils Absolute 0.1  0.0 - 0.7 K/uL   Basophils Relative 0  0 - 1 %   Basophils Absolute 0.0  0.0 - 0.1 K/uL  COMPREHENSIVE METABOLIC PANEL      Component Value Range   Sodium 141  135 - 145 mEq/L   Potassium 4.1  3.5 - 5.1 mEq/L   Chloride 109  96 - 112 mEq/L   CO2 22  19 - 32 mEq/L   Glucose, Bld 111 (*) 70 - 99 mg/dL   BUN 36 (*) 6 - 23 mg/dL   Creatinine, Ser 5.62  0.50 - 1.35 mg/dL   Calcium 9.8  8.4 - 13.0 mg/dL   Total Protein 6.6  6.0 - 8.3 g/dL   Albumin 3.6  3.5 - 5.2 g/dL   AST 13  0 - 37 U/L   ALT 19  0 - 53 U/L   Alkaline Phosphatase 74  39 - 117 U/L   Total Bilirubin 0.7  0.3 - 1.2 mg/dL   GFR calc non Af Amer >90  >90 mL/min   GFR calc Af Amer >90  >90 mL/min  SAMPLE TO BLOOD BANK      Component Value Range   Blood Bank Specimen SAMPLE AVAILABLE FOR TESTING     Sample Expiration 08/27/2012     CRITICAL CARE Performed by: Sunnie Nielsen   Total critical care time: 30  Critical care time was exclusive of separately billable procedures and treating other patients.  Critical care was necessary to treat or prevent imminent or life-threatening deterioration.  Critical care was time spent personally by me on the following activities: development of treatment plan with patient and/or surrogate as well as nursing, discussions with consultants, evaluation of patient's response to treatment, examination of patient, obtaining history from patient or surrogate, ordering and performing treatments and  interventions, ordering and review of laboratory studies, ordering and review of radiographic studies,  pulse oximetry and re-evaluation of patient's condition. IV drips. Fluid resus. Orthostatic dizziness BP drops to 90s with concern for sig GI bleeding.   IVFs.   IV protonix. T/C  Labs.   2:05 AM d/w GI on call, recs protonix drip and will see in am. Plan MED admit.   2:11 AM d/w [facility] - plan admit hospitalist service to step down, bolus IVFs and second IV, repeat H/H now.   MDM  VS and nursing notes reviewed. CXR, labs, IVFs, GI and MED consults. protonix IV drip. T/C. Serial evaluations.          Sunnie Nielsen, MD 08/27/12 [phone]

## 2012-08-27 NOTE — H&P (Addendum)
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  9/9 am noted a black bowel movement.  As the day progressed, would have loose and granular stools which bwere blac-they looked like they had a rough texture and would alternate with frank liquid dark stools-passed 12 stools per report until he came here.  Last stool was 01:00 am 9/10.   NO abd pain-just feels "blah" as he thoguht it was from not taking his BP meds. 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 Patient has not had a upper endoscopy in the past   Review of Systems: no HA, NO dizzyness on standing, some balance issues, no SOB, NO CP, NO sweats, Never had a GI bleed before, no focal neurological deficit or other issue  Chart review   ES[facility] left ureteric calculus 2003 2.   Bilateral renal calculi dating to 1978, with extracorporeal shockwave lithotripsy on the left in 1989, and over 30 calculi passed by history   Hypertension.   Eye surgery in 1969.    Hernia repair in 1987 and 1986.    Past Medical History  Diagnosis Date  . Hypertension   . Lower back pain   . GI bleed due to NSAIDs    Past Surgical History  Procedure Date  .  Extracorporeal shock wave lithotripsy     2003  . Hernia repair     87, 86  . Eye surgery     1969   Social History:  reports that he has never smoked. He does not have any smokeless tobacco history on file. He reports that he does not drink alcohol. His drug history not on file.  No Known Allergies  Family History  Problem Relation Age of Onset  . Heart failure Father   . Stroke Mother   . Hypertension Brother      Prior to Admission medications   Medication Sig Start Date End Date Taking? Authorizing Provider  AMLODIPINE BESYLATE PO Take 1 tablet by mouth 2 (two) times daily.   Yes Historical Provider, MD  METOPROLOL TARTRATE PO Take 1 tablet by mouth every evening.   Yes Historical Provider, MD  Olmesartan Medoxomil (BENICAR PO) Take 1 tablet by mouth daily.   Yes Historical Provider, MD   Physical Exam: Filed Vitals:   08/27/12 0155 08/27/12 0156 08/27/12 0157 08/27/12 0158  BP:  116/68 111/74 99/72  Pulse: 121 118 117 126  Temp:      TempSrc:      Resp: 29 18 16 17   SpO2: 100% 99% 99% 100%     General:  Very pleasant morbidly obese Caucasian male Morbid obesity, There is no height or weight on file to calculate BMI.\  Eyes: No pallor  no icterus  ENT: Mallampati stage IV  Neck: Soft nontender, no thyromegaly  Cardiovascular: S1-S2 no murmur rub or gallop tachycardic  Respiratory: Clinically clear  Abdomen: Obese abdominal scars noted, no rebound or guarding no epigastric tenderness on deep palpation or pressure  Skin: Calluses and deformity to the left forearm  Musculoskeletal: All 4 limbs equally move without any issue  Psychiatric: Euthymic and very pleasant  Neurologic: Grossly intact  Labs on Admission:  Basic Metabolic Panel:  Lab 08/26/12 1610  NA 141  K 4.1  CL 109  CO2 22  GLUCOSE 111*  BUN 36*  CREATININE 0.77  CALCIUM 9.8  MG --  PHOS --   Liver Function Tests:  Lab 08/26/12 2047  AST 13  ALT 19  ALKPHOS 74  BILITOT 0.7    PROT 6.6  ALBUMIN 3.6   No results found for this basename: LIPASE:5,AMYLASE:5 in the last 168 hours No results found for this basename: AMMONIA:5 in the last 168 hours CBC:  Lab 08/26/12 2047  WBC 14.4*  NEUTROABS 10.5*  HGB 13.7  HCT 40.1  MCV 91.8  PLT 233   Cardiac Enzymes: No results found for this basename: CKTOTAL:5,CKMB:5,CKMBINDEX:5,TROPONINI:5 in the last 168 hours  BNP (last 3 results) No results found for this basename: PROBNP:3 in the last 8760 hours CBG: No results found for this basename: GLUCAP:5 in the last 168 hours  Radiological Exams on Admission: No results found.  EKG: Independently reviewed. Tachycardic  Assessment/Plan Active Problems:  GI bleed due to NSAIDs  Lower back pain  Hypertension  Obesity (BMI 35.0-39.9 without comorbidity)  Degenerative disc disease  OSA (obstructive sleep apnea)   1. Upper GI bleed-B. and creatinine confirms this. Patient has been placed on IV Protonix and kept n.p.o. Continue IV fluids. I have requested ED physician to get another hematocrit to see where he currently is in terms of his hemoglobin. Patient will be typed and screened as well and his hemoglobin drops below 7 he will need to be transfused. Expect that this will occur given the volume of his stools, as well as dilutional effect of saline. Dr Leslie Soto has been consulted by emergency room physician and is aware of patient-please insure that patient is seen by him tomorrow----Low-grade temperatures and leukocytosis-this potentially could represent Ischemic colitis versus infectious colitis-am inclined to believe that that is not the case but will get a lactic acid just in case to rule this out. Patient seems very comfortable and has no pain so I would hold off on a CT scan for now but if he spikes a temperature or develops pain, he'll need a CT angiogram of the abdomen pelvis with contrast to delineate further intra-abdominal pathology-if he has diarrhea again I  would get stool cultures and C. difficile 2. Lower back pain and osteoarthritis and knee osteoarthritis-patient will need multiple pain management and I would recommend PT OT to give him some exercises to strengthen his quadriceps and hamstrings. He will obviously need significant weight loss but has lost 60 pounds over the past 8 months and was congratulated for this 3. Morbid obesity-patient's height is not recorded but he will need to have weight loss-he may be candidate for bariatric surgery given her multiple comorbidities 4. Impaired glucose tolerance?-We'll get an A1c in the morning. Patient has metabolic syndrome and we will get a liver panel as well to insure there is no other changes to his metabolic profile 5. Obstructive sleep apnea-patient uses BiPAP at night but does not remember  settings-please reorder this tomorrow night 6. Hypertension-we'll hold all blood pressure medications at present time given his acute GI bleed-place when necessary hydralazine for blood pressures above 200/100  Code Status: Full Family Communication: Brother in room and updated fully Disposition Plan: Admit to the step down  Time spent: 45 minutes  Mahala Menghini [facility] Pager [phone]  If 7PM-7AM, please contact night-coverage [email or web address] Password [email or web address] 08/27/2012, 2:19 AM

## 2012-08-27 NOTE — ED Notes (Signed)
Orthostatics reported to ermd, patient denied dizziness with standing.  Patient denies pain

## 2012-08-27 NOTE — ED Notes (Signed)
Report called to receiving RN,  Patient remains w/o s/sx of distress

## 2012-08-27 NOTE — ED Notes (Signed)
Patient remains alert and oriented.

## 2012-08-27 NOTE — Consult Note (Signed)
Referring Provider: No ref. provider found Primary Care Physician:  Thayer Headings, MD Primary Gastroenterologist:  None  Reason for Consultation:  GIB  HPI: Leslie Soto is a 51 y.o. male who was in his regular state of health until yesterday morning (9/9) when he started passing black stools.  He describes them as jet black in color.  Multiple times (greater than 12) while at home and in the ER.  Last BM was 4am today.  Hgb was 13.7 grams on admit and is now down to 11.0 grams.  He is on PPI gtt currently.  Denies any previous history of GIB in the past.  Uses 4 baby ASA a day, but denies any other NSAID use.  Denies nausea, vomiting, abdominal pain, fevers, decreased appetite, problems with heartburn/reflux.  Had lost 60 pounds, but was intentional.  Had colonoscopy ten to twelve years ago, but never had an EGD.  No family history of GI illness to his knowledge.  BM's were normal prior to this starting yesterday AM.   Past Medical History  Diagnosis Date  . Hypertension   . Lower back pain   . GI bleed due to NSAIDs   . Obesity (BMI 35.0-39.9 without comorbidity)   . Spina bifida     ?  Marland Kitchen Degenerative disc disease     ?  . OSA (obstructive sleep apnea)     Dr. Venetia Maxon, [place]-uses BIPAP at night    Past Surgical History  Procedure Date  . Extracorporeal shock wave lithotripsy     2003  . Hernia repair     87, 86  . Eye surgery     1969    Prior to Admission medications   Medication Sig Start Date End Date Taking? Authorizing Provider  amLODipine (NORVASC) 10 MG tablet Take 10 mg by mouth 2 (two) times daily.   Yes Historical Provider, MD  metoprolol succinate (TOPROL-XL) 50 MG 24 hr tablet Take 50 mg by mouth daily. Take with or immediately following a meal.   Yes Historical Provider, MD  olmesartan-hydrochlorothiazide (BENICAR HCT) 40-25 MG per tablet Take 1 tablet by mouth daily.   Yes Historical Provider, MD    Current Facility-Administered Medications  Medication  Dose Route Frequency Provider Last Rate Last Dose  . 0.9 %  sodium chloride infusion   Intravenous Continuous Sunnie Nielsen, MD 125 mL/hr at 08/27/12 0525    . hydrALAZINE (APRESOLINE) injection 5 mg  5 mg Intravenous Q6H PRN Rhetta Mura, MD      . morphine 2 MG/ML injection 1 mg  1 mg Intravenous Q4H PRN Rhetta Mura, MD      . pantoprazole (PROTONIX) 80 mg in sodium chloride 0.9 % 250 mL infusion  8 mg/hr Intravenous Once Sunnie Nielsen, MD 25 mL/hr at 08/27/12 0400 8 mg/hr at 08/27/12 0400  . pantoprazole (PROTONIX) injection 40 mg  40 mg Intravenous Once Sunnie Nielsen, MD   40 mg at 08/27/12 0152  . sodium chloride 0.9 % bolus 1,000 mL  1,000 mL Intravenous Once Sunnie Nielsen, MD   1,000 mL at 08/27/12 0231  . sodium chloride 0.9 % injection 3 mL  3 mL Intravenous Q12H Rhetta Mura, MD        Allergies as of 08/26/2012  . (No Known Allergies)    Family History  Problem Relation Age of Onset  . Heart failure Father   . Stroke Mother   . Hypertension Brother     History   Social History  . Marital Status:  Widowed    Spouse Name: N/A    Number of Children: N/A  . Years of Education: N/A   Occupational History  . Not on file.   Social History Main Topics  . Smoking status: Never Smoker   . Smokeless tobacco: Not on file  . Alcohol Use: No  . Drug Use: No  . Sexually Active: No   Other Topics Concern  . Not on file   Social History Narrative  . No narrative on file    Review of Systems: Ten point ROS O/W negative except as mentioned in HPI.  Physical Exam: Vital signs in last 24 hours: Temp:  [97.4 F (36.3 C)-99.3 F (37.4 C)] 97.4 F (36.3 C) (09/10 0717) Pulse Rate:  [109-126] 109  (09/10 0300) Resp:  [16-29] 20  (09/10 0300) BP: (99-187)/(54-110) 120/67 mmHg (09/10 0300) SpO2:  [97 %-100 %] 100 % (09/10 0300) Weight:  [327 lb 13.2 oz (148.7 kg)] 327 lb 13.2 oz (148.7 kg) (09/10 0300) Last BM Date: 08/27/12 General:   Alert,  Well-developed,  well-nourished, pleasant and cooperative in NAD Head:  Normocephalic and atraumatic. Eyes:  Sclera clear, no icterus.  Conjunctiva pink. Ears:  Normal auditory acuity. Mouth:  No deformity or lesions.   Lungs:  Clear throughout to auscultation.  No wheezes, crackles, or rhonchi. Heart:  Tachy but with regular rhythm; no murmurs, clicks, rubs, or gallops. Abdomen:  Soft, nontender, BS active, nonpalp mass or hsm.   Rectal:  Deferred.  Heme positive in the ED.  Msk:  Symmetrical without gross deformities. Pulses:  Normal pulses noted. Extremities:  Without clubbing or edema. Neurologic:  Alert and  oriented x4;  grossly normal neurologically. Skin:  Intact without significant lesions or rashes. Psych:  Alert and cooperative. Normal mood and affect.  Intake/Output from previous day: 09/09 0701 - 09/10 0700 In: 1000 [I.V.:1000] Out: -   Lab Results:  Basename 08/27/12 0759 08/27/12 0211 08/26/12 2047  WBC -- 14.7* 14.4*  HGB 11.0* 11.8* 13.7  HCT 32.5* 34.2* 40.1  PLT -- 217 233   BMET  Basename 08/26/12 2047  NA 141  K 4.1  CL 109  CO2 22  GLUCOSE 111*  BUN 36*  CREATININE 0.77  CALCIUM 9.8   LFT  Basename 08/26/12 2047  PROT 6.6  ALBUMIN 3.6  AST 13  ALT 19  ALKPHOS 74  BILITOT 0.7  BILIDIR --  IBILI --   Studies/Results: Dg Chest Portable 1 View  08/27/2012  *RADIOLOGY REPORT*  Clinical Data: Shortness of breath.  Black stools.  Nausea and dizziness.  PORTABLE CHEST - 1 VIEW  Comparison: None.  Findings: Slight fibrosis in the lung bases.  Possible emphysematous changes.  Normal heart size and pulmonary vascularity.  No focal airspace consolidation.  No pneumothorax. Mediastinal contours appear intact.  IMPRESSION: No evidence of active pulmonary disease.   Original Report Authenticated By: Marlon Pel, M.D.     IMPRESSION:  -Likely UGIB with melena and heme positive stool.  No NSAID use, just ASA at home.  Rule out PUD, esophagitis, gastritis, AVM,  malignancy, etc. -Drop in Hgb about 2.5 grams, likely somewhat dilutional, but also related to #1  PLAN: -Plan for EGD 9/11. -Can D/C PPI gtt and just give IV BID. -Clears for now. -Monitor Hgb and transfuse if needed.  ZEHR, JESSICA D.  08/27/2012, 10:06 AM  Pager number [phone]  Chart was reviewed and patient was examined. X-rays were reviewed.   Patient is having what appears  to be upper GI bleed. Suspect active ulcer disease in the face of ASA therapy.    Recommendations #1 PPI drip #2 maintain hemoglobin above 8 #3 upper endoscopy as scheduled for September 11  Kiptyn Rafuse D. Arlyce Dice, M.D., [facility] Cell [phone]

## 2012-08-27 NOTE — ED Notes (Signed)
Admitting MD to bedside 

## 2012-08-27 NOTE — Progress Notes (Signed)
Utilization review completed.  

## 2012-08-27 NOTE — Progress Notes (Addendum)
[facility] Progress Note [place] TEAM 1 - Stepdown/ICU TEAM   Soul G Carmical MRN:[id number] DOB: 11/06/1961 DOA: 08/27/2012 PCP: MACKENZIE,BRIAN, MD  Brief narrative: 51-year-old male patient who presented to the hospital with dark melena, malaise and frequent stools. 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 In the emergency department he was found to have a stable hemoglobin of 13 but stools were Hemoccult positive. He was hemodynamically stable except for tachycardia which improved after administration of IV fluids. Because of concerns of ongoing GI bleeding patient was admitted to the step down unit for further monitoring and evaluation.  Assessment/Plan: Principal Problem:  *GI bleed due to NSAIDs *Appreciate gastroenterology assistance *GI has changed Protonix infusion to every 12 hour IV PPI *Plan is to proceed with endoscopy on 08/28/2012  Active Problems:  Acute blood loss anemia/Melena *Last dark/black colored stool around 4 AM *Hemoglobin has decreased 2 g since admission *Will follow CBC every 12 hours   Sinus tachycardia *Reflective of cardiac demand in setting of anemia and dehydration *Currently has resolved   Leukocytosis *Only low-grade fever and is more indicative of an acute inflammatory process related to acute GI bleeding   Chronic Lower back pain/ Degenerative disc disease *Patient states Tylenol works just as effectively as aspirin so this will be his preferred medication at time of discharge   Hypertension *Patient's blood pressure soft in setting of dehydration and ongoing GI bleeding therefore home medications are on hold (Norvasc, Toprol and Benicar hydrochlorothiazide)   Obesity (BMI 35.0-39.9 without comorbidity)/OSA (obstructive sleep apnea) - nocturnal BiPAP *Continue nocturnal BiPAP while here *She endorses volitional weight loss of 60 pounds over the past year   DVT  prophylaxis: SCDs. No pharmacological prophylaxis due to GI bleeding Code Status: Full Family Communication: Spoke directly with patient Disposition Plan: Remain in step down  Consultants: [facility]  Procedures: None  Antibiotics: None  HPI/Subjective: Patient is alert and continues to endorse some dark stools although decreased frequency as prior to admission. Denies chest pain, shortness of breath or dizziness at rest or with ambulation.   Objective: Blood pressure 120/67, pulse 109, temperature 98.3 F (36.8 C), temperature source Oral, resp. rate 20, height 5' (1.524 m), weight 148.7 kg (327 lb 13.2 oz), SpO2 100.00%.  Intake/Output Summary (Last 24 hours) at 08/27/12 1250 Last data filed at 08/27/12 1132  Gross per 24 hour  Intake   1000 ml  Output      0 ml  Net   1000 ml     Exam: Patient admitted during the past 24 hours and received complete physical exam at that time therefore physical exam deferred.  Data Reviewed: Basic Metabolic Panel:  Lab 08/26/12 2047  NA 141  K 4.1  CL 109  CO2 22  GLUCOSE 111*  BUN 36*  CREATININE 0.77  CALCIUM 9.8  MG --  PHOS --   Liver Function Tests:  Lab 08/26/12 2047  AST 13  ALT 19  ALKPHOS 74  BILITOT 0.7  PROT 6.6  ALBUMIN 3.6   No results found for this basename: LIPASE:5,AMYLASE:5 in the last 168 hours No results found for this basename: AMMONIA:5 in the last 168 hours CBC:  Lab 08/27/12 0759 08/27/12 0211 08/26/12 2047  WBC -- 14.7* 14.4*  NEUTROABS -- -- 10.5*  HGB 11.0* 11.8* 13.7  HCT 32.5* 34.2* 40.1  MCV -- 90.5 91.8  PLT -- 217 233   Cardiac Enzymes: No results found for   this basename: CKTOTAL:5,CKMB:5,CKMBINDEX:5,TROPONINI:5 in the last 168 hours BNP (last 3 results) No results found for this basename: PROBNP:3 in the last 8760 hours CBG: No results found for this basename: GLUCAP:5 in the last 168 hours  Recent Results (from the past 240 hour(s))  MRSA PCR SCREENING      Status: Normal   Collection Time   08/27/12  4:46 AM      Component Value Range Status Comment   MRSA by PCR NEGATIVE  NEGATIVE Final      Studies:  Recent x-ray studies have been reviewed in detail by the Attending Physician  Scheduled Meds:  Reviewed in detail by the Attending Physician   Allison Ellis, ANP [facility] Office  [phone] Pager [phone]  On-Call/Text Page:      [email or web address]      password [email or web address]  If 7PM-7AM, please contact night-coverage [email or web address] Password [email or web address] 08/27/2012, 12:50 PM   LOS: 0 days   I have examined the patient, reviewed the chart and modified the above note which I agree with.   Kinzee Happel, MD [phone] 

## 2012-08-28 ENCOUNTER — Encounter (HOSPITAL_COMMUNITY): Payer: Self-pay

## 2012-08-28 ENCOUNTER — Encounter (HOSPITAL_COMMUNITY)
Admission: EM | Disposition: A | Discharge status: Home / Self Care | Payer: Self-pay | Source: Home / Self Care | Attending: Internal Medicine

## 2012-08-28 DIAGNOSIS — K254 Chronic or unspecified gastric ulcer with hemorrhage: Principal | ICD-10-CM

## 2012-08-28 DIAGNOSIS — I10 Essential (primary) hypertension: Secondary | ICD-10-CM

## 2012-08-28 HISTORY — DX: Chronic or unspecified gastric ulcer with hemorrhage: K25.4

## 2012-08-28 HISTORY — PX: ESOPHAGOGASTRODUODENOSCOPY: SHX5428

## 2012-08-28 LAB — CBC
HCT: 27.5 % — ABNORMAL LOW (ref 39.0–52.0)
HCT: 30.7 % — ABNORMAL LOW (ref 39.0–52.0)
Hemoglobin: 9.4 g/dL — ABNORMAL LOW (ref 13.0–17.0)
Hemoglobin: 10.5 g/dL — ABNORMAL LOW (ref 13.0–17.0)
MCH: 31.4 pg (ref 26.0–34.0)
MCHC: 34.2 g/dL (ref 30.0–36.0)
MCV: 91.9 fL (ref 78.0–100.0)
Platelets: 192 10*3/uL (ref 150–400)
RBC: 3.02 MIL/uL — ABNORMAL LOW (ref 4.22–5.81)
RBC: 3.34 MIL/uL — ABNORMAL LOW (ref 4.22–5.81)

## 2012-08-28 SURGERY — EGD (ESOPHAGOGASTRODUODENOSCOPY)
Anesthesia: Moderate Sedation

## 2012-08-28 MED ORDER — DIPHENHYDRAMINE HCL 50 MG/ML IJ SOLN
INTRAMUSCULAR | Status: DC | PRN
  Administered 2012-08-28 (×2): 12.5 mg via INTRAVENOUS

## 2012-08-28 MED ORDER — FENTANYL CITRATE 0.05 MG/ML IJ SOLN
INTRAMUSCULAR | Status: AC
  Filled 2012-08-28: qty 2

## 2012-08-28 MED ORDER — MIDAZOLAM HCL 5 MG/ML IJ SOLN
INTRAMUSCULAR | Status: AC
  Filled 2012-08-28: qty 2

## 2012-08-28 MED ORDER — GLYCOPYRROLATE 0.2 MG/ML IJ SOLN
INTRAMUSCULAR | Status: DC | PRN
  Administered 2012-08-28: 0.2 mg via INTRAVENOUS

## 2012-08-28 MED ORDER — AMLODIPINE BESYLATE 10 MG PO TABS: 10.0000 mg | ORAL_TABLET | Freq: Two times a day (BID) | ORAL | Status: DC

## 2012-08-28 MED ORDER — BUTAMBEN-TETRACAINE-BENZOCAINE 2-2-14 % EX AERO
INHALATION_SPRAY | CUTANEOUS | Status: DC | PRN
  Administered 2012-08-28: 2 via TOPICAL

## 2012-08-28 MED ORDER — GLYCOPYRROLATE 0.2 MG/ML IJ SOLN
INTRAMUSCULAR | Status: AC
  Filled 2012-08-28: qty 1

## 2012-08-28 MED ORDER — AMLODIPINE BESYLATE 10 MG PO TABS
10.0000 mg | ORAL_TABLET | Freq: Every day | ORAL | Status: DC
  Administered 2012-08-28 – 2012-08-29 (×2): 10 mg via ORAL
  Filled 2012-08-28 (×3): qty 1

## 2012-08-28 MED ORDER — FENTANYL CITRATE 0.05 MG/ML IJ SOLN
INTRAMUSCULAR | Status: DC | PRN
  Administered 2012-08-28 (×4): 25 ug via INTRAVENOUS

## 2012-08-28 MED ORDER — SODIUM CHLORIDE 0.9 % IV SOLN
INTRAVENOUS | Status: DC
  Administered 2012-08-28: 13:00:00 via INTRAVENOUS

## 2012-08-28 MED ORDER — METOPROLOL SUCCINATE ER 50 MG PO TB24
50.0000 mg | ORAL_TABLET | Freq: Every day | ORAL | Status: DC
  Administered 2012-08-28 – 2012-08-29 (×2): 50 mg via ORAL
  Filled 2012-08-28 (×3): qty 1

## 2012-08-28 MED ORDER — MIDAZOLAM HCL 5 MG/5ML IJ SOLN
INTRAMUSCULAR | Status: DC | PRN
  Administered 2012-08-28 (×3): 2 mg via INTRAVENOUS
  Administered 2012-08-28: 1 mg via INTRAVENOUS

## 2012-08-28 MED ORDER — DIPHENHYDRAMINE HCL 50 MG/ML IJ SOLN
INTRAMUSCULAR | Status: AC
  Filled 2012-08-28: qty 1

## 2012-08-28 NOTE — Progress Notes (Signed)
Patient received from endo. V/s/s. No c/o pain or distress at this time. Mother at bedside. Will continue to monitor.

## 2012-08-28 NOTE — Progress Notes (Signed)
Endoscopy demonstrates active ulcers in the prepyloric and pyloric channel areas. Ulcers are clean-based.  Recommend high-dose PPI therapy for 2 weeks. Would avoid NSAIDs. If stable can be discharged in a.m.

## 2012-08-28 NOTE — Op Note (Signed)
[facility] [address] [place], [address]   ENDOSCOPY PROCEDURE REPORT  PATIENT: Leslie Soto, Leslie Soto  MR#: [id number] BIRTHDATE: November 24, 1961 , 51  yrs. old GENDER: Male ENDOSCOPIST: Louis Meckel, MD REFERRED BY: PROCEDURE DATE:  08/28/2012 PROCEDURE:  EGD w/ biopsy ASA CLASS:     Class II INDICATIONS: MEDICATIONS: These medications were titrated to patient response per physician's verbal order, Fentanyl 100 mcg IV, Versed 7 mg IV, and Robinul 0.2 mg IV TOPICAL ANESTHETIC: Cetacaine Spray  DESCRIPTION OF PROCEDURE: After the risks benefits and alternatives of the procedure were thoroughly explained, informed consent was obtained.  The Pentax Gastroscope [id number] endoscope was introduced through the mouth and advanced to the third portion of the duodenum. Without limitations.  The instrument was slowly withdrawn as the mucosa was fully examined.    There were at least 2 superficial clean-based ulcers in the pyloric channel and distal antrum measuring 2 mm.  Surrounding mucosa was edematous and erythematous.  There are enlarged and erythematous folds in the duodenum.  Biopsies were taken.   The remainder of the upper endoscopy exam was otherwise normal.  Retroflexed views revealed no abnormalities.     The scope was then withdrawn from the patient and the procedure completed.  COMPLICATIONS: There were no complications. ENDOSCOPIC IMPRESSION: 1.   clean-based prepyloric and pyloric channel ulcers 2.   The remainder of the upper endoscopy exam was otherwise normal  RECOMMENDATIONS: continue PPI therapy; avoid NSAIDs REPEAT EXAM:  eSigned:  Louis Meckel, MD 08/28/2012 5:15 PM   CC:

## 2012-08-28 NOTE — H&P (View-Only) (Signed)
[facility] Progress Note [place] TEAM 1 - Stepdown/ICU TEAM   Leslie Soto:[id number] DOB: Jun 09, 1961 DOA: 08/27/2012 PCP: Thayer Headings, MD  Brief narrative: 51 year old male patient who presented to the hospital with dark melena, malaise and frequent stools. 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 In the emergency department he was found to have a stable hemoglobin of 13 but stools were Hemoccult positive. He was hemodynamically stable except for tachycardia which improved after administration of IV fluids. Because of concerns of ongoing GI bleeding patient was admitted to the step down unit for further monitoring and evaluation.  Assessment/Plan: Principal Problem:  *GI bleed due to NSAIDs *Appreciate gastroenterology assistance *GI has changed Protonix infusion to every 12 hour IV PPI *Plan is to proceed with endoscopy on 08/28/2012  Active Problems:  Acute blood loss anemia/Melena *Last dark/black colored stool around 4 AM *Hemoglobin has decreased 2 g since admission *Will follow CBC every 12 hours   Sinus tachycardia *Reflective of cardiac demand in setting of anemia and dehydration *Currently has resolved   Leukocytosis *Only low-grade fever and is more indicative of an acute inflammatory process related to acute GI bleeding   Chronic Lower back pain/ Degenerative disc disease *Patient states Tylenol works just as effectively as aspirin so this will be his preferred medication at time of discharge   Hypertension *Patient's blood pressure soft in setting of dehydration and ongoing GI bleeding therefore home medications are on hold (Norvasc, Toprol and Benicar hydrochlorothiazide)   Obesity (BMI 35.0-39.9 without comorbidity)/OSA (obstructive sleep apnea) - nocturnal BiPAP *Continue nocturnal BiPAP while here *She endorses volitional weight loss of 60 pounds over the past year   DVT  prophylaxis: SCDs. No pharmacological prophylaxis due to GI bleeding Code Status: Full Family Communication: Spoke directly with patient Disposition Plan: Remain in step down  Consultants: [facility]  Procedures: None  Antibiotics: None  HPI/Subjective: Patient is alert and continues to endorse some dark stools although decreased frequency as prior to admission. Denies chest pain, shortness of breath or dizziness at rest or with ambulation.   Objective: Blood pressure 120/67, pulse 109, temperature 98.3 F (36.8 C), temperature source Oral, resp. rate 20, height 5' (1.524 m), weight 148.7 kg (327 lb 13.2 oz), SpO2 100.00%.  Intake/Output Summary (Last 24 hours) at 08/27/12 1250 Last data filed at 08/27/12 1132  Gross per 24 hour  Intake   1000 ml  Output      0 ml  Net   1000 ml     Exam: Patient admitted during the past 24 hours and received complete physical exam at that time therefore physical exam deferred.  Data Reviewed: Basic Metabolic Panel:  Lab 08/26/12 9562  NA 141  K 4.1  CL 109  CO2 22  GLUCOSE 111*  BUN 36*  CREATININE 0.77  CALCIUM 9.8  MG --  PHOS --   Liver Function Tests:  Lab 08/26/12 2047  AST 13  ALT 19  ALKPHOS 74  BILITOT 0.7  PROT 6.6  ALBUMIN 3.6   No results found for this basename: LIPASE:5,AMYLASE:5 in the last 168 hours No results found for this basename: AMMONIA:5 in the last 168 hours CBC:  Lab 08/27/12 0759 08/27/12 0211 08/26/12 2047  WBC -- 14.7* 14.4*  NEUTROABS -- -- 10.5*  HGB 11.0* 11.8* 13.7  HCT 32.5* 34.2* 40.1  MCV -- 90.5 91.8  PLT -- 217 233   Cardiac Enzymes: No results found for  this basename: CKTOTAL:5,CKMB:5,CKMBINDEX:5,TROPONINI:5 in the last 168 hours BNP (last 3 results) No results found for this basename: PROBNP:3 in the last 8760 hours CBG: No results found for this basename: GLUCAP:5 in the last 168 hours  Recent Results (from the past 240 hour(s))  MRSA PCR SCREENING      Status: Normal   Collection Time   08/27/12  4:46 AM      Component Value Range Status Comment   MRSA by PCR NEGATIVE  NEGATIVE Final      Studies:  Recent x-ray studies have been reviewed in detail by the Attending Physician  Scheduled Meds:  Reviewed in detail by the Attending Physician   [name], ANP [facility] Office  [phone] Pager [phone]  On-Call/Text Page:      [name]      password [email or web address]  If 7PM-7AM, please contact night-coverage [email or web address] Password [email or web address] 08/27/2012, 12:50 PM   LOS: 0 days   I have examined the patient, reviewed the chart and modified the above note which I agree with.   Calvert Cantor, MD [phone]

## 2012-08-28 NOTE — Progress Notes (Signed)
[facility] Progress Note [name]   Leslie Soto ZOX:[id number] DOB: [birth year] DOA: 08/27/2012 PCP: Thayer Headings, MD  Brief narrative: 51 year old male patient who presented to the hospital with melena, malaise and frequent stools. 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 In the emergency department he was found to have a stable hemoglobin of 13 but stools were Hemoccult positive. He was hemodynamically stable except for tachycardia which improved after administration of IV fluids. Because of concerns of ongoing GI bleeding patient was admitted to the step down unit for further monitoring and evaluation.  Assessment/Plan:  GI bleed due to NSAIDs *Appreciate gastroenterology assistance *GI has changed Protonix infusion to every 12 hour IV PPI *Plan is to proceed with endoscopy 08/28/2012  Acute blood loss anemia/Melena *Last dark/black colored stool around 4 AM *Hemoglobin has decreased to 9.4 since admission from 13.7 *Will follow CBC every 12 hours - transfuse as needed for Hgb <7.0  Sinus tachycardia *Resolved *Reflective of cardiac demand in setting of anemia and dehydration-decrease IV fluids to 50 cc per hour  Leukocytosis *Only low-grade fever and is more indicative of an acute inflammatory process related to acute GI bleeding  Chronic Lower back pain/ Degenerative disc disease *Patient states Tylenol works just as effectively as aspirin so this will be his preferred medication at time of discharge  Hypertension *Patient's blood pressure soft in setting of dehydration and ongoing GI bleeding therefore home medications are on hold (Norvasc, Toprol and Benicar hydrochlorothiazide)  Obesity (BMI 35.0-39.9 without comorbidity)/OSA (obstructive sleep apnea) - nocturnal BiPAP *Continue nocturnal CPAP while here *endorses volitional weight loss of 60 pounds over the past year  DVT  prophylaxis: SCDs. No pharmacological prophylaxis due to GI bleeding Code Status: Full Family Communication: Spoke directly with patient Disposition Plan: Transfer to floor after endoscopy  Consultants: [facility]  Procedures: None  Antibiotics: None  HPI/Subjective: Alert. No further black stools. No abdominal pain. Awaiting endoscopy. Questions answered regarding when can return to work.   Objective: Blood pressure 124/68, pulse 88, temperature 97.4 F (36.3 C), temperature source Oral, resp. rate 19, height 5' (1.524 m), weight 148.7 kg (327 lb 13.2 oz), SpO2 99.00%.  Intake/Output Summary (Last 24 hours) at 08/28/12 1104 Last data filed at 08/27/12 2200  Gross per 24 hour  Intake   1625 ml  Output      1 ml  Net   1624 ml     Exam: Gen.: In no acute distress, appears stated age Lungs: Clear to auscultation bilaterally, on room air Heart: Heart sounds S1-S2 without rubs murmurs or gallops, tachycardia has resolved, no peripheral edema, sinus rhythm  Abdomen: Obese but soft nontender nondistended bowel sounds are present Musculoskeletal: Extremities are symmetrical in appearance without cyanosis or clubbing Neurological: Alert and oriented x3, moves all extremities x4, exam non focal  Data Reviewed: Basic Metabolic Panel:  Lab 08/26/12 8119  NA 141  K 4.1  CL 109  CO2 22  GLUCOSE 111*  BUN 36*  CREATININE 0.77  CALCIUM 9.8  MG --  PHOS --   Liver Function Tests:  Lab 08/26/12 2047  AST 13  ALT 19  ALKPHOS 74  BILITOT 0.7  PROT 6.6  ALBUMIN 3.6   CBC:  Lab 08/28/12 0253 08/27/12 1608 08/27/12 0759 08/27/12 0211 08/26/12 2047  WBC 10.1 10.9* -- 14.7* 14.4*  NEUTROABS -- -- -- -- 10.5*  HGB 9.4* 10.2* 11.0* 11.8* 13.7  HCT 27.5* 29.8* 32.5* 34.2* 40.1  MCV 91.1 91.4 -- 90.5 91.8  PLT 157 178 -- 217 233    Recent Results (from the past 240 hour(s))  MRSA PCR SCREENING     Status: Normal   Collection Time   08/27/12  4:46 AM       Component Value Range Status Comment   MRSA by PCR NEGATIVE  NEGATIVE Final      Studies:  Recent x-ray studies have been reviewed in detail by the Attending Physician  Scheduled Meds:  Reviewed in detail by the Attending Physician   [name], ANP [facility] Office  [phone] Pager [phone]  On-Call/Text Page:      [name]      password [email or web address]  If 7PM-7AM, please contact night-coverage [email or web address] Password [email or web address] 08/28/2012, 11:04 AM   LOS: 1 day   I have personally examined this patient and reviewed the entire database. I have reviewed the above note, made any necessary editorial changes, and agree with its content.  [name], MD [facility]

## 2012-08-28 NOTE — Interval H&P Note (Signed)
History and Physical Interval Note:  08/28/2012 4:31 PM  Leslie Soto  has presented today for surgery, with the diagnosis of Melena; UGIB  The various methods of treatment have been discussed with the patient and family. After consideration of risks, benefits and other options for treatment, the patient has consented to  Procedure(s) (LRB) with comments: ESOPHAGOGASTRODUODENOSCOPY (EGD) (N/A) as a surgical intervention .  The patient's history has been reviewed, patient examined, no change in status, stable for surgery.  I have reviewed the patient's chart and labs.  Questions were answered to the patient's satisfaction.     The recent H&P (dated *08/27/12 **) was reviewed, the patient was examined and there is no change in the patients condition since that H&P was completed.   Leslie Soto  08/28/2012, 4:31 PM   Leslie Soto

## 2012-08-29 ENCOUNTER — Encounter (HOSPITAL_COMMUNITY): Payer: Self-pay | Admitting: Gastroenterology

## 2012-08-29 ENCOUNTER — Encounter (HOSPITAL_COMMUNITY): Payer: Self-pay

## 2012-08-29 MED ORDER — PANTOPRAZOLE SODIUM 40 MG PO TBEC: 40.0000 mg | DELAYED_RELEASE_TABLET | Freq: Two times a day (BID) | ORAL | Status: DC

## 2012-08-29 NOTE — Progress Notes (Signed)
Patient is s/p EGD 9/11 with only two clean based ulcers seen (prepyloric and pyloric channel); no active bleeding.  Had two BM's this AM that were normal.  Hgb up to 10.5 grams this AM.  Continue BID PPI for two weeks, then once daily for a total of 8 weeks.  Patient to speak with PCP regarding ASA and possibly decreasing that to 81mg  daily since it is only for health maintenance.  Patient ok for D/C from a GI standpoint.

## 2012-08-29 NOTE — Progress Notes (Signed)
Patient given discharge information.  All material was covered with patient.  All of patient's questions were answered to his satisfaction.  Advised patient about follow-up appointment and to ask about an influenza vaccine at that time.  Vitals and labs stable.  Environmental check with patient was done.  Patient states he does have all of his belongings.  Richardean Canal, RN

## 2012-08-29 NOTE — Sedation Documentation (Signed)
Administered Versed 7 mg during EGD. Mistakenly entered 2 mg used in [employer].  Could not fix it.

## 2012-08-30 ENCOUNTER — Telehealth: Payer: Self-pay | Admitting: Gastroenterology

## 2012-08-30 NOTE — Progress Notes (Signed)
I have personally taken an interval history, reviewed the chart, and examined the patient.  I agree with the extender's note, impression and recommendations.  

## 2012-08-30 NOTE — Telephone Encounter (Signed)
Spoke with patient and explained that he should have f/u with his PCP and that he would need his note from work from the hospitalist that worked with him

## 2012-09-11 NOTE — Discharge Summary (Signed)
Physician Discharge Summary  Patient ID: Leslie Soto MRN: [id number] DOB/AGE: [birth year] 51 y.o.  Admit date: 08/27/2012 Discharge date: 09/11/2012  Primary Care Physician:  Leslie Headings, MD   Discharge Diagnoses:    Principal Problem:  *GI bleed due to NSAIDs  Gastric ulcers Active Problems:  Lower back pain  Hypertension  Obesity (BMI 35.0-39.9 without comorbidity)  Degenerative disc disease  OSA (obstructive sleep apnea) - nocturnal BiPAP  Acute blood loss anemia  Sinus tachycardia  Leukocytosis  Melena  Gastric ulcer with hemorrhage      Medication List     As of 09/11/2012  6:57 PM    STOP taking these medications         olmesartan-hydrochlorothiazide 40-25 MG per tablet   Commonly known as: BENICAR HCT      TAKE these medications         amLODipine 10 MG tablet   Commonly known as: NORVASC   Take 10 mg by mouth 2 (two) times daily.      aspirin 81 MG tablet   Take 81 mg by mouth daily.      metoprolol succinate 50 MG 24 hr tablet   Commonly known as: TOPROL-XL   Take 50 mg by mouth daily. Take with or immediately following a meal.      pantoprazole 40 MG tablet   Commonly known as: PROTONIX   Take 1 tablet (40 mg total) by mouth 2 (two) times daily. For 1 month then Daily         Disposition and Follow-up:  GI in 2 weeks PCP in 1 week  Consults:  [facility] Dr.Kaplan   Significant Diagnostic Studies:  EGD Endoscopy demonstrates active ulcers in the prepyloric and pyloric channel areas. Ulcers are clean-based.  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 9/9 am noted a black bowel movement. As the day progressed, would have loose and granular stools which bwere blac-they looked like they had a rough texture and would alternate with frank liquid dark stools-passed 12 stools per report until he came here. Last stool was 01:00 am 9/10.  NO abd  pain-just feels "blah" as he thoguht it was from not taking his BP meds. 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 Patient has not had a upper endoscopy in the past    Hospital Course:  GI bleed due to NSAIDs  EGD with 2 clean base gastric ulcers *Appreciate gastroenterology assistance  *continue PPI BID for 2 weeks then Daily Advised to stop NSAID  Acute blood loss anemia/Melena  *Last dark/black colored stool around 4 AM  *Hemoglobin has decreased to 9.4 since admission from 13.7  Did not drop further and was not transfused  Sinus tachycardia  *Resolved  *Reflective of cardiac demand in setting of anemia and dehydration-  Leukocytosis  *Only low-grade fever and  is more indicative of an acute inflammatory process related to acute GI bleeding   Chronic Lower back pain/ Degenerative disc disease  *Patient states Tylenol works just as effectively as aspirin so this will be his preferred medication at time of discharge   Hypertension  *Patient's blood pressure soft in setting of dehydration and ongoing GI bleeding therefore home medications were on hold. Subsequently only Norvasc and Toprol were resumed   Obesity (BMI 35.0-39.9 without comorbidity)/OSA (obstructive sleep apnea) - nocturnal BiPAP  *Continue nocturnal CPAP while here  *endorses volitional weight loss of 60 pounds over the past year    Time spent on  Discharge:  SignedZannie Cove [facility] Pager: [phone] 09/11/2012, 6:57 PM

## 2013-01-08 ENCOUNTER — Ambulatory Visit: Payer: Self-pay | Admitting: Urology

## 2013-03-29 ENCOUNTER — Other Ambulatory Visit (HOSPITAL_COMMUNITY): Payer: Self-pay | Admitting: Cardiovascular Disease

## 2013-03-29 DIAGNOSIS — R0602 Shortness of breath: Secondary | ICD-10-CM

## 2013-03-29 DIAGNOSIS — I272 Pulmonary hypertension, unspecified: Secondary | ICD-10-CM

## 2013-04-01 ENCOUNTER — Encounter (INDEPENDENT_AMBULATORY_CARE_PROVIDER_SITE_OTHER): Payer: Self-pay | Admitting: General Surgery

## 2013-04-01 ENCOUNTER — Ambulatory Visit (INDEPENDENT_AMBULATORY_CARE_PROVIDER_SITE_OTHER): Payer: BC Managed Care – PPO | Admitting: General Surgery

## 2013-04-01 ENCOUNTER — Other Ambulatory Visit (INDEPENDENT_AMBULATORY_CARE_PROVIDER_SITE_OTHER): Payer: Self-pay | Admitting: General Surgery

## 2013-04-01 VITALS — BP 160/88 | HR 67 | Temp 98.0°F | Resp 18 | Ht 71.0 in | Wt 350.4 lb

## 2013-04-01 DIAGNOSIS — K439 Ventral hernia without obstruction or gangrene: Secondary | ICD-10-CM

## 2013-04-01 DIAGNOSIS — K4090 Unilateral inguinal hernia, without obstruction or gangrene, not specified as recurrent: Secondary | ICD-10-CM

## 2013-04-01 NOTE — Progress Notes (Signed)
Subjective:     Patient ID: Leslie Soto, male   DOB: 10-07-1961, 52 y.o.   MRN: [id number]  HPI The patient is a 52 year old male who is referred by Dr. Thea Silversmith for evaluation of a left inguinal hernia. The patient states that approximately week ago he was lifting his boat trailer and felt a ripping to his left abdominal wall/angle area. He's had pain since then. She's had a previous history of a right inguinal hernia repair back in 1983 as well as an umbilical hernia repair in 98. The patient does at one point he did have incarceration and was able to reduce the hernia.  Review of Systems  Constitutional: Negative.   HENT: Negative.   Respiratory: Negative.   Cardiovascular: Negative.   Gastrointestinal: Negative.   Genitourinary: Negative.   Neurological: Negative.        Objective:   Physical Exam  Constitutional: He is oriented to person, place, and time. He appears well-developed and well-nourished.  HENT:  Head: Normocephalic and atraumatic.  Eyes: Conjunctivae and EOM are normal. Pupils are equal, round, and reactive to light.  Neck: Normal range of motion. Neck supple.  Cardiovascular: Normal rate, regular rhythm and normal heart sounds.   Pulmonary/Chest: Effort normal and breath sounds normal.  Abdominal: Soft.  Musculoskeletal: Normal range of motion.  Neurological: He is alert and oriented to person, place, and time.       Assessment:     52 year old male with a questionable abdominal wall hernia versus a left inguinal hernia.    Plan:     1. At this point we will proceed with CT scan of his abdomen and pelvis without contrast to evaluate his hernia.   2. We'll have patient followup at the CT scan for discussion of surgical repair.

## 2013-04-03 ENCOUNTER — Ambulatory Visit
Admission: RE | Admit: 2013-04-03 | Discharge: 2013-04-03 | Disposition: A | Discharge status: Home / Self Care | Payer: BC Managed Care – PPO | Source: Ambulatory Visit | Attending: General Surgery | Admitting: General Surgery

## 2013-04-03 ENCOUNTER — Telehealth (INDEPENDENT_AMBULATORY_CARE_PROVIDER_SITE_OTHER): Payer: Self-pay | Admitting: General Surgery

## 2013-04-03 ENCOUNTER — Ambulatory Visit (HOSPITAL_COMMUNITY)
Admission: RE | Admit: 2013-04-03 | Discharge: 2013-04-03 | Disposition: A | Discharge status: Home / Self Care | Payer: BC Managed Care – PPO | Source: Ambulatory Visit | Attending: Cardiovascular Disease | Admitting: Cardiovascular Disease

## 2013-04-03 DIAGNOSIS — I272 Pulmonary hypertension, unspecified: Secondary | ICD-10-CM

## 2013-04-03 DIAGNOSIS — R0602 Shortness of breath: Secondary | ICD-10-CM

## 2013-04-03 DIAGNOSIS — I2789 Other specified pulmonary heart diseases: Secondary | ICD-10-CM | POA: Insufficient documentation

## 2013-04-03 DIAGNOSIS — K4090 Unilateral inguinal hernia, without obstruction or gangrene, not specified as recurrent: Secondary | ICD-10-CM

## 2013-04-03 HISTORY — PX: TRANSTHORACIC ECHOCARDIOGRAM: SHX275

## 2013-04-03 NOTE — Telephone Encounter (Signed)
CT results faxed to Dr Thea Silversmith at [phone].

## 2013-04-03 NOTE — Telephone Encounter (Signed)
Called patient per verbal orders from AR to tell patient that he needed to contact Dr.McKenzie's office to set up an appt to get on an antibiotic...patient stated that he was already ahead of me and has an appt today 4/17 at 1:30 to discuss what type of antibiotic he needs..patient was ok with instructions

## 2013-04-03 NOTE — Progress Notes (Signed)
[facility]   2D echo completed 04/03/2013.   Cindy Hyden Soley, RDCS  

## 2013-04-03 NOTE — Telephone Encounter (Signed)
Erskine Squibb with [facility] called a report on patient's CT. It showed diverticulitis, no hernia. I called and LMOM for patient to call back. To make him aware we received CT results, Dr Derrell Lolling reviewed and advised he should follow up with his PCP re: the diverticulitis. Awaiting call back.

## 2013-04-03 NOTE — Telephone Encounter (Signed)
Patient also aware that 4/25 appt with AR has been cancelled and is fine with this

## 2013-04-03 NOTE — Telephone Encounter (Signed)
Patient called back at this time and was given CT results.  Patient will call PMD regarding diverticulitis.

## 2013-04-08 ENCOUNTER — Other Ambulatory Visit: Payer: BC Managed Care – PPO

## 2013-04-09 ENCOUNTER — Encounter (INDEPENDENT_AMBULATORY_CARE_PROVIDER_SITE_OTHER): Payer: Self-pay

## 2013-04-11 ENCOUNTER — Encounter (INDEPENDENT_AMBULATORY_CARE_PROVIDER_SITE_OTHER): Payer: BC Managed Care – PPO | Admitting: General Surgery

## 2013-06-19 IMAGING — CR DG ABDOMEN 1V
1 series · 3 of 3 positions shown · non-contrast
Comparison: none

REASON FOR EXAM: Calculus
COMMENTS:

[Series 1: t abdomen supine · 0.14mm/px · 3 of 3 slices shown]
[im 1/3]
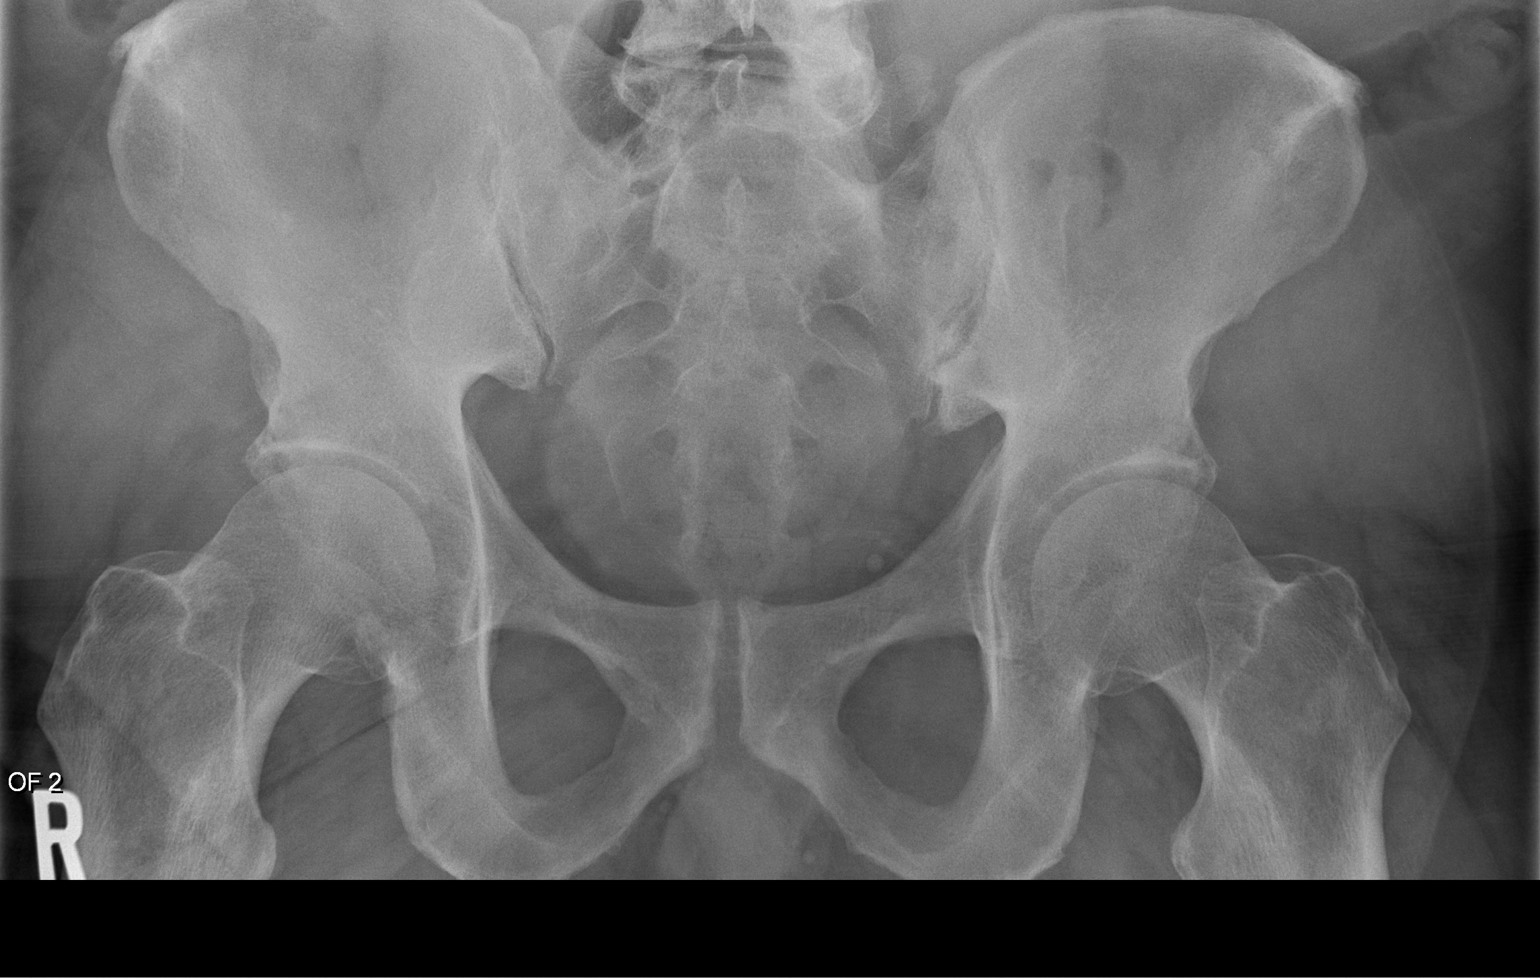
[im 2/3]
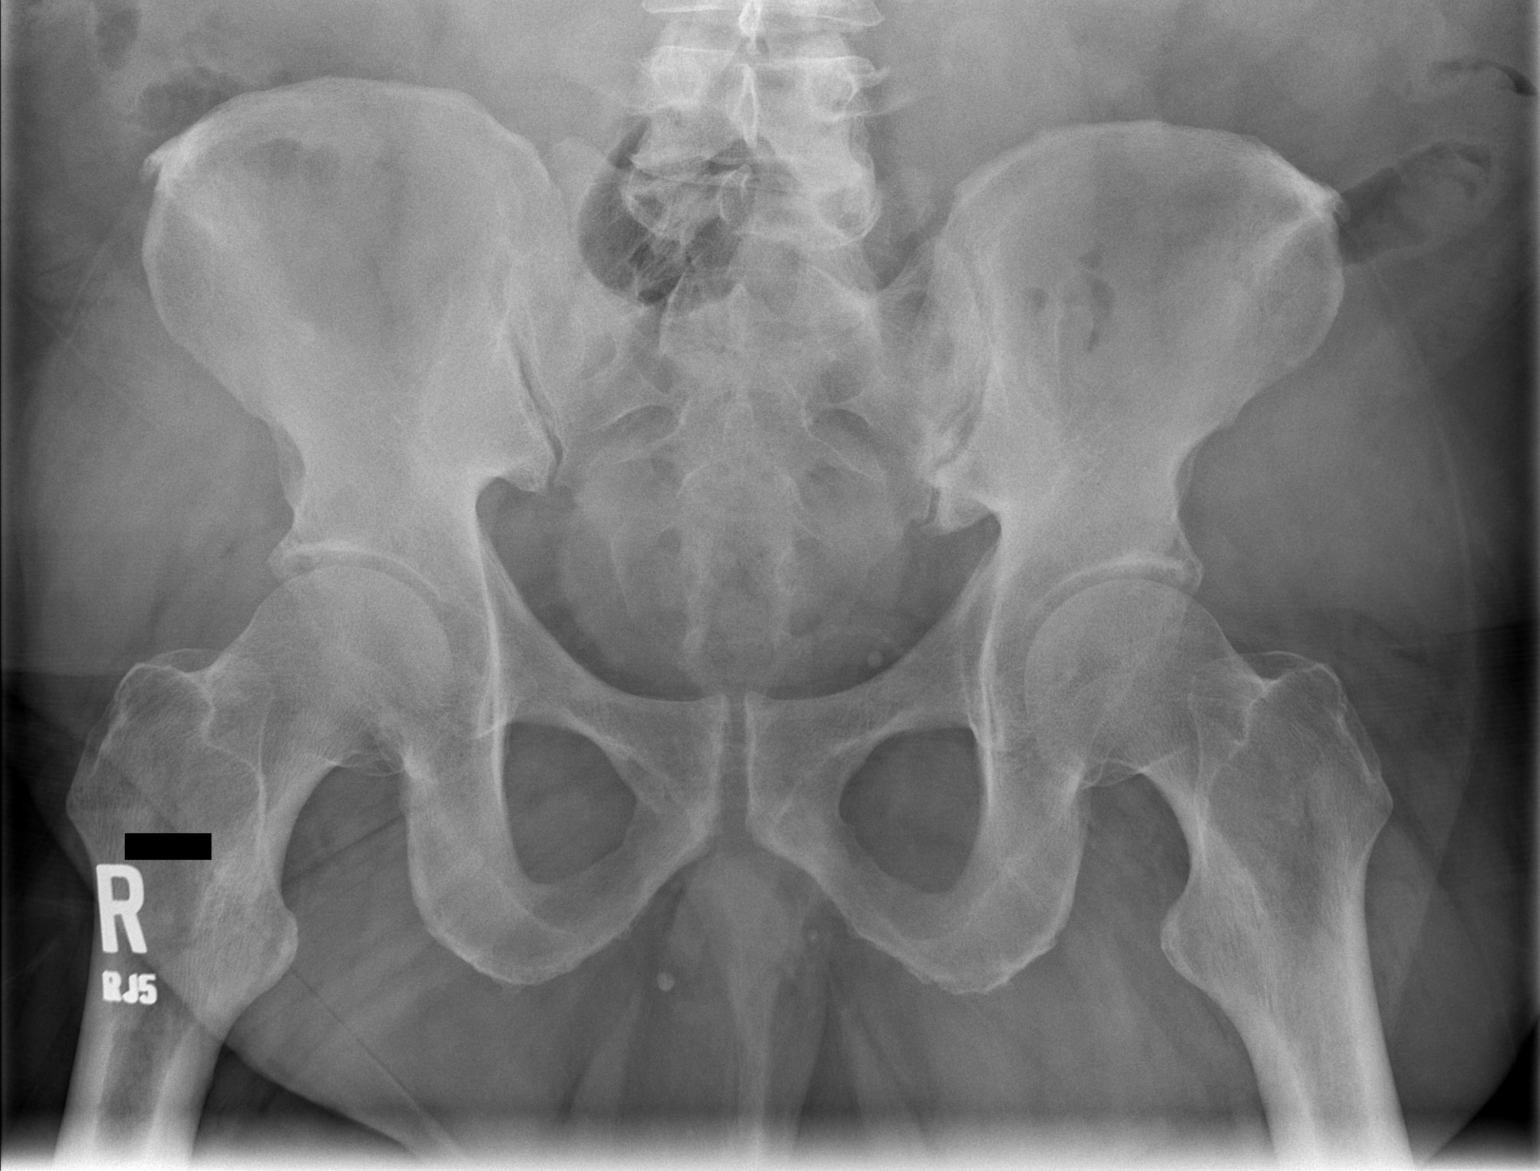
[im 3/3]
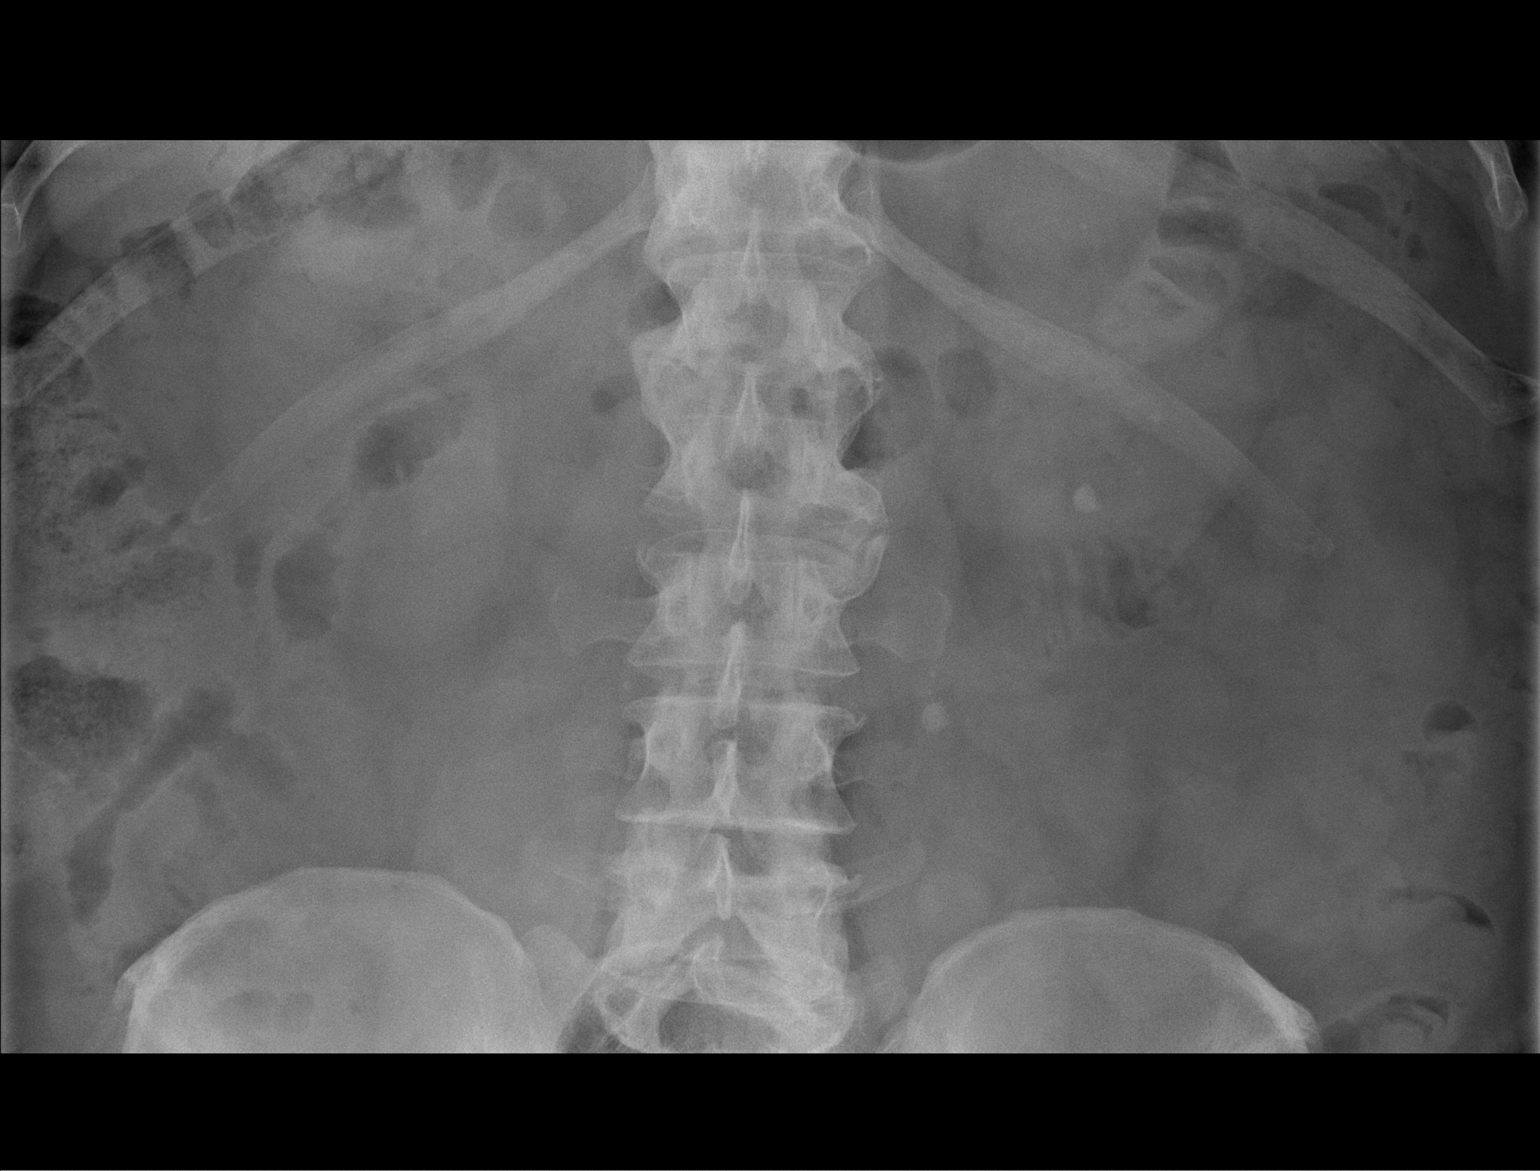

[3 of 3 positions shown; findings below may reference images not displayed]

PROCEDURE:     DXR - DXR KIDNEY URETER BLADDER  - February 08, 2012  [DATE]

RESULT:     Comparison is made to the prior exam of 02/01/2012. There is an
8-mm calcification projected over the lower pole of the left kidney and a
faint 2-mm calcification projected over the midpole region of the left
kidney. There is an 8-mm calcification projected inferior to the L2 lumbar
transverse process on the left consistent with a left ureteral stone.

On the right, there are noted 1 or 2 tiny densities suspicious for tiny
lower pole right renal stones with the larger measuring approximately 3-mm.
No right ureteral stones are seen.
IMPRESSION: 1. Left ureterolithiasis.
2. Left nephrolithiasis.
3. Faint calcifications consistent with nephrolithiasis are noted at the
lower pole of the right kidney.

## 2013-07-03 IMAGING — CR DG ABDOMEN 1V
1 series · 3 of 3 positions shown · non-contrast
Comparison: none

REASON FOR EXAM: nephrolithaisis and renal colic
COMMENTS:

PROCEDURE:     DXR - DXR KIDNEY URETER BLADDER  - February 22, 2012  [DATE]
RESULT:     Comparison: 02/08/2012

[Series 4: t abdomen supine · 0.14mm/px · 3 of 3 slices shown]
[im 1/3]
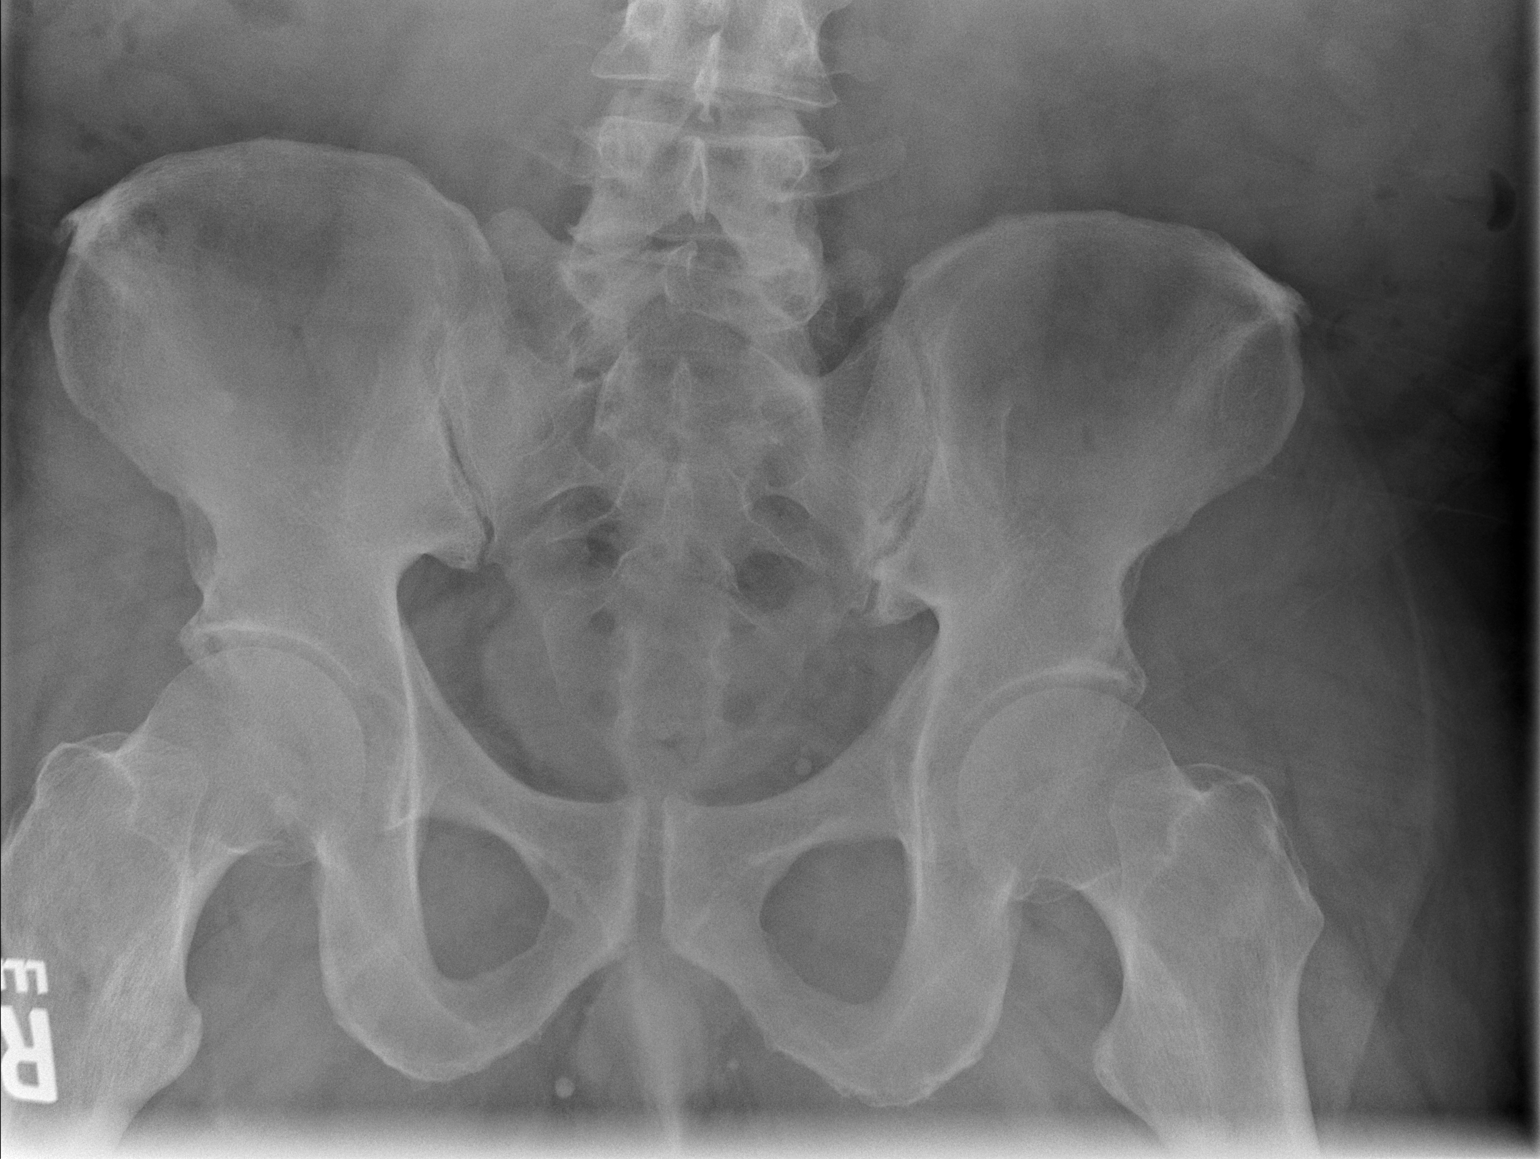
[im 2/3]
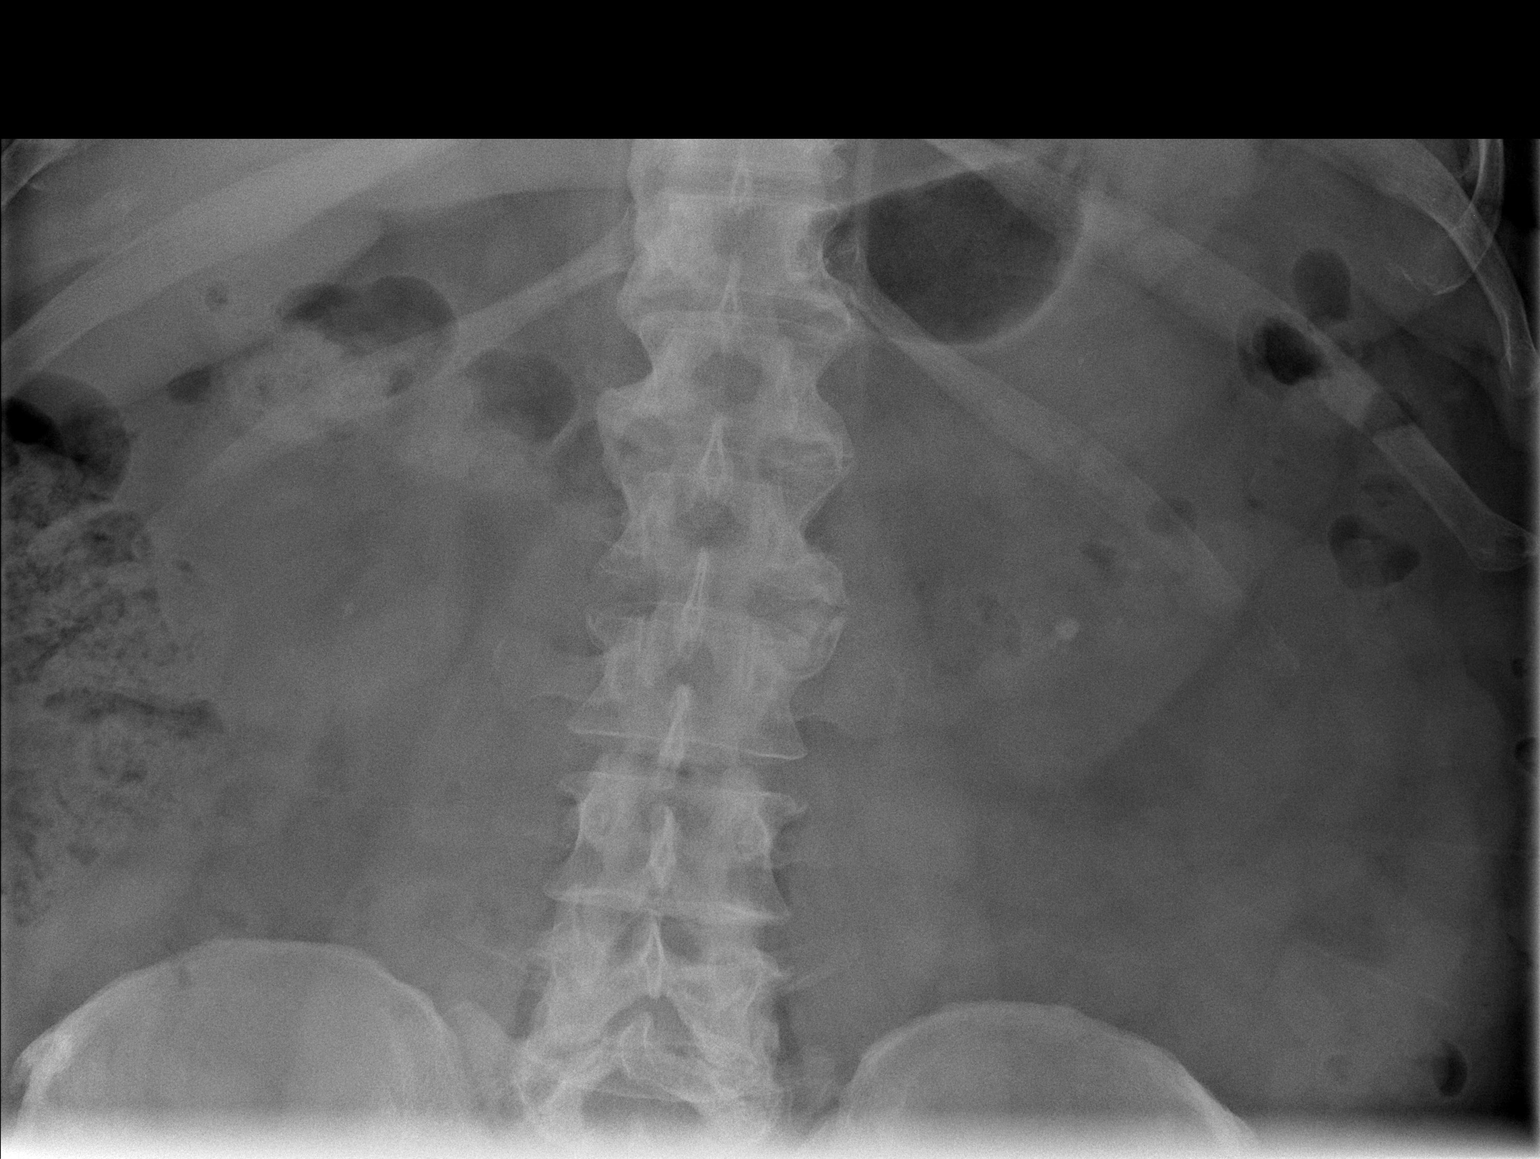
[im 3/3]
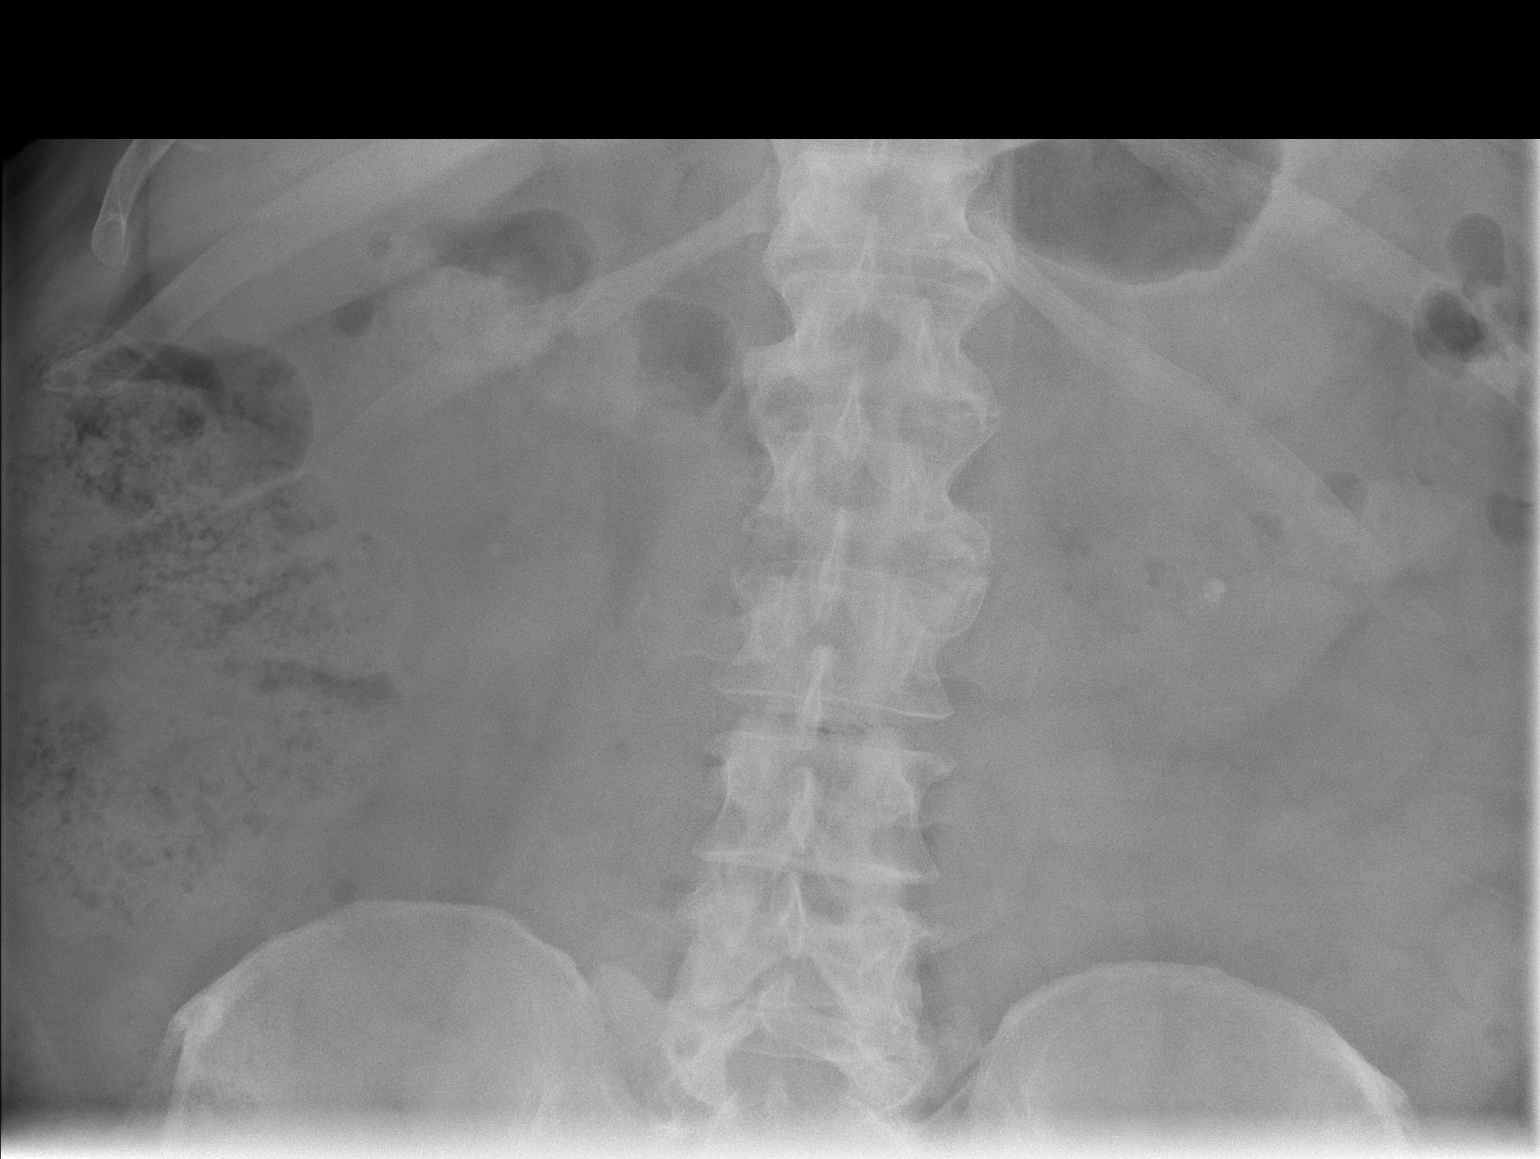

[3 of 3 positions shown; findings below may reference images not displayed]

FINDINGS: Bilateral renal calculi are similar to prior. The calculus previously seen
in the region of the left mid to proximal ureter is not appreciated on this
study. Small calcification in the left the pelvis is similar to prior and
likely secondary to a phlebolith.
IMPRESSION: Bilateral nephrolithiasis. The calculus overlying the left mid ureter is no
longer appreciated.

## 2014-12-29 ENCOUNTER — Encounter: Payer: Self-pay | Admitting: Cardiovascular Disease

## 2014-12-29 ENCOUNTER — Ambulatory Visit (INDEPENDENT_AMBULATORY_CARE_PROVIDER_SITE_OTHER): Payer: BLUE CROSS/BLUE SHIELD | Admitting: Cardiovascular Disease

## 2014-12-29 VITALS — BP 160/100 | HR 78 | Ht 70.0 in | Wt 373.8 lb

## 2014-12-29 DIAGNOSIS — I10 Essential (primary) hypertension: Secondary | ICD-10-CM

## 2014-12-29 DIAGNOSIS — E559 Vitamin D deficiency, unspecified: Secondary | ICD-10-CM

## 2014-12-29 DIAGNOSIS — E782 Mixed hyperlipidemia: Secondary | ICD-10-CM

## 2014-12-29 DIAGNOSIS — G4733 Obstructive sleep apnea (adult) (pediatric): Secondary | ICD-10-CM

## 2014-12-29 MED ORDER — OLMESARTAN MEDOXOMIL-HCTZ 40-25 MG PO TABS: 1.0000 | ORAL_TABLET | Freq: Every day | ORAL | Status: DC

## 2014-12-29 NOTE — Patient Instructions (Addendum)
Your physician wants you to follow-up in: 6 months or sooner if needed with Dr. Claiborne Billings. You will receive a reminder letter in the mail two months in advance. If you don't receive a letter, please call our office to schedule the follow-up appointment.   Your physician has recommended you make the following change in your medication: benicar will be increased to 40-25

## 2014-12-31 ENCOUNTER — Encounter: Payer: Self-pay | Admitting: Cardiovascular Disease

## 2014-12-31 DIAGNOSIS — E782 Mixed hyperlipidemia: Secondary | ICD-10-CM | POA: Insufficient documentation

## 2014-12-31 DIAGNOSIS — E559 Vitamin D deficiency, unspecified: Secondary | ICD-10-CM | POA: Insufficient documentation

## 2014-12-31 NOTE — Progress Notes (Addendum)
Patient ID: Leslie Soto, male   DOB: October 21, 1961, 54 y.o.   MRN: [id number]     PATIENT PROFILE: Leslie Soto is a 54 y.o. male who is a former patient of Dr. Rollene Fare.  He has not seen Dr. Rollene Fare in almost 2 years.  He presents to the office to establish cardiology care with me today after Dr. Lowella Fairy retirement from his solo practice.  HPI:  Leslie Soto has a history of morbid obesity, hypertension, obstructive sleep apnea on CPAP therapy, and has had recurrent episodes of nephrolithiasis.  He is a Administrator for [employer].  In the past, he also has been documented to have peptic ulcer disease with both prepyloric and pyloric channel ulcers.  He is followed by Dr. Noah Delaine for primary care.  He has a history of hypertension for at least 30 years and recently has been taking amlodipine 10 mg, Toprol-XL 50 mg and Benicar HCT 40/12.5 mg.  He has been on CPAP therapy for at least 3 years and uses a full face mask.  He continues to work long hours and typically works 60 hour weeks.  He has continued to have difficulty with recurrent kidney stones.  Review of the chart indicates that he had a nuclear perfusion study in June 2013 which revealed normal perfusion.  An echo Doppler study was done in April 2014 which showed an ejection fraction of 55-60%.  He had normal wall motion and normal diastolic parameters.  There was evidence for mild pulmonary hypertension with a PA pressure 37 mm.  Remotely, he had lost approximately 120 pounds when his wife died in Mar 20, 1998.  Subsequent has gained this weight back over the last several years has gained approximately 20-30 pounds.  He denies chest pain.  He denies palpitations.  He denies PND or orthopnea.  He recently had laboratory by Dr. Noah Delaine from 12/21/2014.  I personally reviewed this laboratory.  Hemoglobin was 16.7, hematocrit 48.8.  White blood count mildly elevated at 12.0.  Fasting glucose was elevated at 117.  Had normal kidney  function and normal LFTs.  Lipid studies revealed a cholesterol of 185, triglycerides 165 LDL cholesterol 120, and he had a very low HDL cholesterol at 32.  He had significant vitamin D deficiency at 11.5.  He has not yet been notified by Dr. Noah Delaine of this laboratory.    Past Medical History  Diagnosis Date  . Hypertension   . Lower back pain   . GI bleed due to NSAIDs   . Obesity (BMI 35.0-39.9 without comorbidity)   . Spina bifida     ?  Marland Kitchen Degenerative disc disease     ?  . OSA (obstructive sleep apnea)     Dr. Vertell Limber, Marty Sadlowski-uses BIPAP at night    Past Surgical History  Procedure Laterality Date  . Extracorporeal shock wave lithotripsy      03-20-2002  . Eye surgery      1968/03/20  . Esophagogastroduodenoscopy  08/28/2012    Procedure: ESOPHAGOGASTRODUODENOSCOPY (EGD);  Surgeon: Inda Castle, MD;  Location: [facility];  Service: Endoscopy;  Laterality: N/A;  . Hernia repair  09/20/1983    87, 86  . Hernia repair  09/25/1997  . Knee arthroscopy  01/20/2011  . Cardiovascular stress test  06/12/2012    Normal pattern of perfusion in all regions, no ECG changes, EKG negative for ischemia.  . Transthoracic echocardiogram  04/03/2013    EF 55-60%, moderate concentric hypertrophy,     No Known Allergies  Current Outpatient Prescriptions  Medication Sig Dispense Refill  . amLODipine (NORVASC) 10 MG tablet Take 10 mg by mouth 2 (two) times daily.    . Fish Oil-Cholecalciferol (OMEGA-3 FISH OIL/VITAMIN D3) 1000-1000 MG-UNIT CAPS Take by mouth.    . Magnesium 500 MG CAPS Take 500 mg by mouth.    . metoprolol succinate (TOPROL-XL) 50 MG 24 hr tablet Take 50 mg by mouth daily. Take with or immediately following a meal.    . tamsulosin (FLOMAX) 0.4 MG CAPS     . olmesartan-hydrochlorothiazide (BENICAR HCT) 40-25 MG per tablet Take 1 tablet by mouth daily. 90 tablet 3   No current facility-administered medications for this visit.    Social history is notable in that he is widowed.  He  has no children.  He lives by himself.  He works long hours.  He is not routinely exercise.  There is no tobacco use.  He does not drink alcohol.  Family History  Problem Relation Age of Onset  . Mental illness Father   . Heart disease Father   . Diabetes Father   . Hypertension Father   . Stroke Mother   . Hypertension Mother   . Hypertension Brother    Is father underwent CABG surgery and is deceased.  His mother is still living at age 74.  He has 3 brothers with mental illness and heart problems.  ROS General: Negative; No fevers, chills, or night sweats; positive for morbid obesity HEENT: Negative; No changes in vision or hearing, sinus congestion, difficulty swallowing Pulmonary: Negative; No cough, wheezing, shortness of breath, hemoptysis Cardiovascular:  See HPI; No chest pain, presyncope, syncope, palpitations, edema GI: Negative; No nausea, vomiting, diarrhea, or abdominal pain GU: Negative; No dysuria, hematuria, or difficulty voiding Musculoskeletal:  Positive for left knee arthroscopic by Dr. Sandie Ano Hematologic/Oncologic: Negative; no easy bruising, bleeding Endocrine: Negative; no heat/cold intolerance; no diabetes Neuro: Negative; no changes in balance, headaches Skin: Negative; No rashes or skin lesions Psychiatric: Negative; No behavioral problems, depression Sleep: Positive for obstructive sleep apnea; No daytime sleepiness, hypersomnolence, bruxism, restless legs, hypnogagnic hallucinations Other comprehensive 14 point system review is negative   Physical Exam BP 160/100 mmHg  Pulse 78  Ht 5\' 10"  (1.778 m)  Wt 373 lb 12.8 oz (169.555 kg)  BMI 53.63 kg/m2 General: Alert, oriented, no distress.  Skin: normal turgor, no rashes, warm and dry HEENT: Normocephalic, atraumatic. Pupils equal round and reactive to light; sclera anicteric; extraocular muscles intact; Fundi arteriolar narrowing.  No hemorrhages or exudates.   Nose without nasal septal  hypertrophy Mouth/Parynx benign; Mallinpatti scale 3/4 Neck: No JVD, no carotid bruits; normal carotid upstroke Lungs: clear to ausculatation and percussion; no wheezing or rales Chest wall: without tenderness to palpitation Heart: PMI not displaced, RRR, s1 s2 normal, 1/6 systolic murmur, no diastolic murmur, no rubs, gallops, thrills, or heaves Abdomen: Morbidly obese with significant central adiposity;soft, nontender; no hepatosplenomehaly, BS+; abdominal aorta nontender and not dilated by palpation. Back: no CVA tenderness Pulses 2+ Musculoskeletal: full range of motion, normal strength, no joint deformities Extremities: Mild lower extremity edema; no clubbing cyanosis, Homan's sign negative  Neurologic: grossly nonfocal; Cranial nerves grossly wnl Psychologic: Normal mood and affect   ECG (independently read by me): Normal sinus rhythm at 78 bpm.  No ectopy.  No significant ST segment changes.  Poor R wave progression.  LABS:  BMET    Component Value Date/Time   NA 141 08/26/2012 2047   K 4.1 08/26/2012 2047  CL 109 08/26/2012 2047   CO2 22 08/26/2012 2047   GLUCOSE 111* 08/26/2012 2047   BUN 36* 08/26/2012 2047   CREATININE 0.77 08/26/2012 2047   CALCIUM 9.8 08/26/2012 2047   GFRNONAA >90 08/26/2012 2047   GFRAA >90 08/26/2012 2047     Hepatic Function Panel     Component Value Date/Time   PROT 6.6 08/26/2012 2047   ALBUMIN 3.6 08/26/2012 2047   AST 13 08/26/2012 2047   ALT 19 08/26/2012 2047   ALKPHOS 74 08/26/2012 2047   BILITOT 0.7 08/26/2012 2047     CBC    Component Value Date/Time   WBC 12.2* 08/28/2012 1848   RBC 3.34* 08/28/2012 1848   HGB 10.5* 08/28/2012 1848   HCT 30.7* 08/28/2012 1848   PLT 192 08/28/2012 1848   MCV 91.9 08/28/2012 1848   MCH 31.4 08/28/2012 1848   MCHC 34.2 08/28/2012 1848   RDW 14.8 08/28/2012 1848   LYMPHSABS 2.7 08/26/2012 2047   MONOABS 1.1* 08/26/2012 2047   EOSABS 0.1 08/26/2012 2047   BASOSABS 0.0 08/26/2012  2047     BNP No results found for: PROBNP  Lipid Panel  No results found for: CHOL, TRIG, HDL, CHOLHDL, VLDL, LDLCALC, LDLDIRECT    RADIOLOGY: No results found.   ASSESSMENT AND PLAN: Leslie Soto is a 54 year old gentleman with super morbid obesity and a body mass index of 53.6.  He has a 30 year history of hypertension.  His blood pressure when initially taken by the nurse was elevated at 160/100 but on repeat by me was 130/80.  He does have mild leg edema.  I have recommended further titration of his Benicar HCT to 40/25 mg, which will be helpful both for improved blood pressure control and leg edema.  I discussed the importance of significant weight loss.  This undoubtedly is contributed to his obstructive sleep apnea.  He tells me he will be having a download with reference to his sleep apnea by Dr. Shelia Media.  With his weight gain adjustments may be necessary in his CPAP pressure.  I discussed with him the importance of exercising at least 150 minutes per week with moderate exercise 5 days per week for 30 minutes at a time if possible.  I reviewed his records from Dr. Rollene Fare and reviewed the laboratory from Dr. Noah Delaine.  He is vitamin D deficient and I suspect will be started on 50,000 units of vitamin D weekly, but I will defer this to Dr. Alyson Ingles.  He is currently taking fish oil over-the-counter.  His lipid profile demonstrates mildly elevated triglycerides and reduced HDL.  His LDL is elevated at 120.  This has increased from 99 one year ago.  He may require initiation of lipid-lowering therapy with low-dose statin, particularly with family history for heart disease.  I would recommend follow-up laboratory in several months if therapy is not instituted and if still abnormal, consider initiation of treatment.  I will see him in 6 months for reevaluation or sooner if problems arise.  Time spent: 45 minutes  Troy Sine, MD, [facility] 12/31/2014 8:40 PM

## 2015-01-19 ENCOUNTER — Encounter: Payer: Self-pay | Admitting: Cardiovascular Disease

## 2015-02-16 ENCOUNTER — Telehealth: Payer: Self-pay | Admitting: *Deleted

## 2015-02-16 NOTE — Telephone Encounter (Signed)
Left message that DMV form is signed and ready for pick up.

## 2015-02-23 ENCOUNTER — Telehealth: Payer: Self-pay | Admitting: *Deleted

## 2015-02-23 NOTE — Telephone Encounter (Signed)
Patient brought in [facility] form in for completion. Will leave for Dr. Claiborne Billings to do when he is in the office next.

## 2015-06-30 ENCOUNTER — Other Ambulatory Visit: Payer: Self-pay | Admitting: *Deleted

## 2015-06-30 MED ORDER — OLMESARTAN MEDOXOMIL-HCTZ 40-25 MG PO TABS: 1.0000 | ORAL_TABLET | Freq: Every day | ORAL | Status: DC

## 2015-07-05 ENCOUNTER — Telehealth: Payer: Self-pay | Admitting: *Deleted

## 2015-07-05 NOTE — Telephone Encounter (Signed)
PA done for Benicar HCT 40/25mg  through CoverMyMeds to [place] [facility] [place].

## 2015-12-21 ENCOUNTER — Other Ambulatory Visit: Payer: Self-pay | Admitting: *Deleted

## 2015-12-21 MED ORDER — OLMESARTAN MEDOXOMIL-HCTZ 40-25 MG PO TABS: 1.0000 | ORAL_TABLET | Freq: Every day | ORAL | Status: DC

## 2016-02-17 ENCOUNTER — Ambulatory Visit
Admission: RE | Admit: 2016-02-17 | Discharge: 2016-02-17 | Disposition: A | Discharge status: Home / Self Care | Payer: Worker's Compensation | Source: Ambulatory Visit | Attending: Pulmonary Disease | Admitting: Pulmonary Disease

## 2016-02-17 ENCOUNTER — Other Ambulatory Visit: Payer: Self-pay | Admitting: Pulmonary Disease

## 2016-02-17 DIAGNOSIS — X58XXXA Exposure to other specified factors, initial encounter: Secondary | ICD-10-CM | POA: Insufficient documentation

## 2016-02-17 DIAGNOSIS — T148XXA Other injury of unspecified body region, initial encounter: Secondary | ICD-10-CM

## 2016-02-17 DIAGNOSIS — S40012A Contusion of left shoulder, initial encounter: Secondary | ICD-10-CM | POA: Insufficient documentation

## 2016-04-21 DIAGNOSIS — E559 Vitamin D deficiency, unspecified: Secondary | ICD-10-CM | POA: Diagnosis not present

## 2016-04-21 DIAGNOSIS — N400 Enlarged prostate without lower urinary tract symptoms: Secondary | ICD-10-CM | POA: Diagnosis not present

## 2016-04-21 DIAGNOSIS — R7309 Other abnormal glucose: Secondary | ICD-10-CM | POA: Diagnosis not present

## 2016-04-21 DIAGNOSIS — I10 Essential (primary) hypertension: Secondary | ICD-10-CM | POA: Diagnosis not present

## 2016-04-28 DIAGNOSIS — E559 Vitamin D deficiency, unspecified: Secondary | ICD-10-CM | POA: Diagnosis not present

## 2016-04-28 DIAGNOSIS — E785 Hyperlipidemia, unspecified: Secondary | ICD-10-CM | POA: Diagnosis not present

## 2016-04-28 DIAGNOSIS — I10 Essential (primary) hypertension: Secondary | ICD-10-CM | POA: Diagnosis not present

## 2016-05-23 DIAGNOSIS — I10 Essential (primary) hypertension: Secondary | ICD-10-CM | POA: Diagnosis not present

## 2016-05-23 DIAGNOSIS — G4733 Obstructive sleep apnea (adult) (pediatric): Secondary | ICD-10-CM | POA: Diagnosis not present

## 2016-05-24 DIAGNOSIS — S8391XA Sprain of unspecified site of right knee, initial encounter: Secondary | ICD-10-CM | POA: Diagnosis not present

## 2016-05-24 DIAGNOSIS — M25561 Pain in right knee: Secondary | ICD-10-CM | POA: Diagnosis not present

## 2016-05-24 DIAGNOSIS — M1711 Unilateral primary osteoarthritis, right knee: Secondary | ICD-10-CM | POA: Diagnosis not present

## 2016-05-26 DIAGNOSIS — M170 Bilateral primary osteoarthritis of knee: Secondary | ICD-10-CM | POA: Diagnosis not present

## 2016-05-26 DIAGNOSIS — M25561 Pain in right knee: Secondary | ICD-10-CM | POA: Diagnosis not present

## 2016-06-02 DIAGNOSIS — R079 Chest pain, unspecified: Secondary | ICD-10-CM | POA: Diagnosis not present

## 2016-06-02 DIAGNOSIS — R05 Cough: Secondary | ICD-10-CM | POA: Diagnosis not present

## 2016-06-02 DIAGNOSIS — J209 Acute bronchitis, unspecified: Secondary | ICD-10-CM | POA: Diagnosis not present

## 2016-06-03 DIAGNOSIS — H5213 Myopia, bilateral: Secondary | ICD-10-CM | POA: Diagnosis not present

## 2016-06-03 DIAGNOSIS — Z135 Encounter for screening for eye and ear disorders: Secondary | ICD-10-CM | POA: Diagnosis not present

## 2016-06-09 DIAGNOSIS — J4520 Mild intermittent asthma, uncomplicated: Secondary | ICD-10-CM | POA: Diagnosis not present

## 2016-08-04 DIAGNOSIS — Z131 Encounter for screening for diabetes mellitus: Secondary | ICD-10-CM | POA: Diagnosis not present

## 2016-08-04 DIAGNOSIS — R7309 Other abnormal glucose: Secondary | ICD-10-CM | POA: Diagnosis not present

## 2016-08-04 DIAGNOSIS — E559 Vitamin D deficiency, unspecified: Secondary | ICD-10-CM | POA: Diagnosis not present

## 2016-08-04 DIAGNOSIS — I10 Essential (primary) hypertension: Secondary | ICD-10-CM | POA: Diagnosis not present

## 2016-08-08 DIAGNOSIS — Z23 Encounter for immunization: Secondary | ICD-10-CM | POA: Diagnosis not present

## 2016-08-08 DIAGNOSIS — R7309 Other abnormal glucose: Secondary | ICD-10-CM | POA: Diagnosis not present

## 2016-08-08 DIAGNOSIS — E785 Hyperlipidemia, unspecified: Secondary | ICD-10-CM | POA: Diagnosis not present

## 2016-08-08 DIAGNOSIS — I10 Essential (primary) hypertension: Secondary | ICD-10-CM | POA: Diagnosis not present

## 2016-12-24 DIAGNOSIS — N3001 Acute cystitis with hematuria: Secondary | ICD-10-CM | POA: Diagnosis not present

## 2016-12-24 DIAGNOSIS — R319 Hematuria, unspecified: Secondary | ICD-10-CM | POA: Diagnosis not present

## 2016-12-27 DIAGNOSIS — R109 Unspecified abdominal pain: Secondary | ICD-10-CM | POA: Diagnosis not present

## 2016-12-27 DIAGNOSIS — Z87442 Personal history of urinary calculi: Secondary | ICD-10-CM | POA: Diagnosis not present

## 2016-12-27 DIAGNOSIS — N50812 Left testicular pain: Secondary | ICD-10-CM | POA: Diagnosis not present

## 2017-01-03 ENCOUNTER — Ambulatory Visit: Payer: Self-pay | Admitting: Physician Assistant

## 2017-01-05 DIAGNOSIS — H04123 Dry eye syndrome of bilateral lacrimal glands: Secondary | ICD-10-CM | POA: Diagnosis not present

## 2017-01-25 DIAGNOSIS — N200 Calculus of kidney: Secondary | ICD-10-CM | POA: Diagnosis not present

## 2017-01-25 DIAGNOSIS — Z125 Encounter for screening for malignant neoplasm of prostate: Secondary | ICD-10-CM | POA: Diagnosis not present

## 2017-01-26 ENCOUNTER — Other Ambulatory Visit: Payer: Self-pay | Admitting: Urology

## 2017-01-26 ENCOUNTER — Encounter (HOSPITAL_COMMUNITY): Payer: Self-pay | Admitting: *Deleted

## 2017-01-26 NOTE — Patient Instructions (Addendum)
Leslie Soto  01/26/2017   Your procedure is scheduled on: 01/31/2017    Report to [facility] Main  Entrance take [place]  elevators to 3rd floor to  [place] at    0800 AM.  Call this number if you have problems the morning of surgery [phone]   Remember: ONLY 1 PERSON MAY GO WITH YOU TO SHORT STAY TO GET  READY MORNING OF [place].  Do not eat food or drink liquids :After Midnight.     Take these medicines the morning of surgery with A SIP OF WATER:                                 You may not have any metal on your body including hair pins and              piercings  Do not wear jewelry,  lotions, powders or perfumes, deodorant                           Men may shave face and neck.   Do not bring valuables to the hospital. [place].  Contacts, dentures or bridgework may not be worn into surgery.      Patients discharged the day of surgery will not be allowed to drive home.  Name and phone number of your driver:                Please read over the following fact sheets you were given: _____________________________________________________________________             [facility] - Preparing for Surgery Before surgery, you can play an important role.  Because skin is not sterile, your skin needs to be as free of germs as possible.  You can reduce the number of germs on your skin by washing with CHG (chlorahexidine gluconate) soap before surgery.  CHG is an antiseptic cleaner which kills germs and bonds with the skin to continue killing germs even after washing. Please DO NOT use if you have an allergy to CHG or antibacterial soaps.  If your skin becomes reddened/irritated stop using the CHG and inform your nurse when you arrive at Short Stay. Do not shave (including legs and underarms) for at least 48 hours prior to the first CHG shower.  You may shave your face/neck. Please follow these  instructions carefully:  1.  Shower with CHG Soap the night before surgery and the  morning of Surgery.  2.  If you choose to wash your hair, wash your hair first as usual with your  normal  shampoo.  3.  After you shampoo, rinse your hair and body thoroughly to remove the  shampoo.                           4.  Use CHG as you would any other liquid soap.  You can apply chg directly  to the skin and wash                       Gently with a scrungie or clean washcloth.  5.  Apply the CHG Soap  to your body ONLY FROM THE NECK DOWN.   Do not use on face/ open                           Wound or open sores. Avoid contact with eyes, ears mouth and genitals (private parts).                       Wash face,  Genitals (private parts) with your normal soap.             6.  Wash thoroughly, paying special attention to the area where your surgery  will be performed.  7.  Thoroughly rinse your body with warm water from the neck down.  8.  DO NOT shower/wash with your normal soap after using and rinsing off  the CHG Soap.                9.  Pat yourself dry with a clean towel.            10.  Wear clean pajamas.            11.  Place clean sheets on your bed the night of your first shower and do not  sleep with pets. Day of Surgery : Do not apply any lotions/deodorants the morning of surgery.  Please wear clean clothes to the hospital/surgery center.  FAILURE TO FOLLOW THESE INSTRUCTIONS MAY RESULT IN THE CANCELLATION OF YOUR SURGERY PATIENT SIGNATURE_________________________________  NURSE SIGNATURE__________________________________  ________________________________________________________________________

## 2017-01-29 ENCOUNTER — Encounter (HOSPITAL_COMMUNITY)
Admission: RE | Admit: 2017-01-29 | Discharge: 2017-01-29 | Disposition: A | Discharge status: Home / Self Care | Payer: BLUE CROSS/BLUE SHIELD | Source: Ambulatory Visit

## 2017-01-30 ENCOUNTER — Encounter (HOSPITAL_COMMUNITY)
Admission: RE | Admit: 2017-01-30 | Discharge: 2017-01-30 | Disposition: A | Discharge status: Home / Self Care | Payer: BLUE CROSS/BLUE SHIELD | Source: Ambulatory Visit | Attending: Urology | Admitting: Urology

## 2017-01-30 ENCOUNTER — Encounter (HOSPITAL_COMMUNITY): Payer: Self-pay

## 2017-01-30 DIAGNOSIS — N400 Enlarged prostate without lower urinary tract symptoms: Secondary | ICD-10-CM | POA: Diagnosis not present

## 2017-01-30 DIAGNOSIS — E669 Obesity, unspecified: Secondary | ICD-10-CM | POA: Diagnosis not present

## 2017-01-30 DIAGNOSIS — I10 Essential (primary) hypertension: Secondary | ICD-10-CM | POA: Diagnosis not present

## 2017-01-30 DIAGNOSIS — Z6841 Body Mass Index (BMI) 40.0 and over, adult: Secondary | ICD-10-CM | POA: Diagnosis not present

## 2017-01-30 DIAGNOSIS — G4730 Sleep apnea, unspecified: Secondary | ICD-10-CM | POA: Diagnosis not present

## 2017-01-30 DIAGNOSIS — N200 Calculus of kidney: Secondary | ICD-10-CM | POA: Diagnosis not present

## 2017-01-30 HISTORY — DX: Personal history of urinary calculi: Z87.442

## 2017-01-30 LAB — CBC
HEMATOCRIT: 47.8 % (ref 39.0–52.0)
Hemoglobin: 16.9 g/dL (ref 13.0–17.0)
MCH: 31.6 pg (ref 26.0–34.0)
MCHC: 35.4 g/dL (ref 30.0–36.0)
MCV: 89.5 fL (ref 78.0–100.0)
PLATELETS: 207 10*3/uL (ref 150–400)
RBC: 5.34 MIL/uL (ref 4.22–5.81)
RDW: 14.4 % (ref 11.5–15.5)
WBC: 11.4 10*3/uL — AB (ref 4.0–10.5)

## 2017-01-30 LAB — BASIC METABOLIC PANEL
Anion gap: 10 (ref 5–15)
BUN: 15 mg/dL (ref 6–20)
CALCIUM: 9.6 mg/dL (ref 8.9–10.3)
CO2: 21 mmol/L — ABNORMAL LOW (ref 22–32)
CREATININE: 0.74 mg/dL (ref 0.61–1.24)
Chloride: 108 mmol/L (ref 101–111)
GFR calc Af Amer: >60 mL/min (ref >60)
Glucose, Bld: 111 mg/dL — ABNORMAL HIGH (ref 65–99)
POTASSIUM: 3.9 mmol/L (ref 3.5–5.1)
SODIUM: 139 mmol/L (ref 135–145)

## 2017-01-30 MED ORDER — GENTAMICIN SULFATE 40 MG/ML IJ SOLN
5.0000 mg/kg | INTRAVENOUS | Status: AC
  Administered 2017-01-31: 520 mg via INTRAVENOUS
  Filled 2017-01-30 (×2): qty 13.5

## 2017-01-30 NOTE — Patient Instructions (Signed)
Leslie Soto  01/30/2017   Your procedure is scheduled on: Wednesday 01/31/2017  Report to [facility]  Entrance take [place]  elevators to 3rd floor to  [place] at  0800 AM.  Call this number if you have problems the morning of surgery [phone]   Remember: ONLY 1 PERSON MAY GO WITH YOU TO SHORT STAY TO GET  READY MORNING OF [place].    Do not eat food or drink liquids :After Midnight.     Take these medicines the morning of surgery with A SIP OF WATER: Amlodipine               BRING YOUR BIPAP MASK AND TUBING WITH YOU MORNING OF SURGERY!                                You may not have any metal on your body including hair pins and              piercings  Do not wear jewelry, make-up, lotions, powders or perfumes, deodorant             Do not wear nail polish.  Do not shave  48 hours prior to surgery.              Men may shave face and neck.   Do not bring valuables to the hospital. [place].  Contacts, dentures or bridgework may not be worn into surgery.  Leave suitcase in the car. After surgery it may be brought to your room.     Patients discharged the day of surgery will not be allowed to drive home.  Name and phone number of your driver:brother- Kshaun Gargan  Special Instructions: N/A              Please read over the following fact sheets you were given: _____________________________________________________________________             [facility] - Preparing for Surgery Before surgery, you can play an important role.  Because skin is not sterile, your skin needs to be as free of germs as possible.  You can reduce the number of germs on your skin by washing with CHG (chlorahexidine gluconate) soap before surgery.  CHG is an antiseptic cleaner which kills germs and bonds with the skin to continue killing germs even after washing. Please DO NOT use if you have an allergy  to CHG or antibacterial soaps.  If your skin becomes reddened/irritated stop using the CHG and inform your nurse when you arrive at Short Stay. Do not shave (including legs and underarms) for at least 48 hours prior to the first CHG shower.  You may shave your face/neck. Please follow these instructions carefully:  1.  Shower with CHG Soap the night before surgery and the  morning of Surgery.  2.  If you choose to wash your hair, wash your hair first as usual with your  normal  shampoo.  3.  After you shampoo, rinse your hair and body thoroughly to remove the  shampoo.                           4.  Use  CHG as you would any other liquid soap.  You can apply chg directly  to the skin and wash                       Gently with a scrungie or clean washcloth.  5.  Apply the CHG Soap to your body ONLY FROM THE NECK DOWN.   Do not use on face/ open                           Wound or open sores. Avoid contact with eyes, ears mouth and genitals (private parts).                       Wash face,  Genitals (private parts) with your normal soap.             6.  Wash thoroughly, paying special attention to the area where your surgery  will be performed.  7.  Thoroughly rinse your body with warm water from the neck down.  8.  DO NOT shower/wash with your normal soap after using and rinsing off  the CHG Soap.                9.  Pat yourself dry with a clean towel.            10.  Wear clean pajamas.            11.  Place clean sheets on your bed the night of your first shower and do not  sleep with pets. Day of Surgery : Do not apply any lotions/deodorants the morning of surgery.  Please wear clean clothes to the hospital/surgery center.  FAILURE TO FOLLOW THESE INSTRUCTIONS MAY RESULT IN THE CANCELLATION OF YOUR SURGERY PATIENT SIGNATURE_________________________________  NURSE SIGNATURE__________________________________  ________________________________________________________________________

## 2017-01-31 ENCOUNTER — Encounter (HOSPITAL_COMMUNITY)
Admission: RE | Disposition: A | Discharge status: Home / Self Care | Payer: Self-pay | Source: Ambulatory Visit | Attending: Urology

## 2017-01-31 ENCOUNTER — Ambulatory Visit (HOSPITAL_COMMUNITY)
Admission: RE | Admit: 2017-01-31 | Discharge: 2017-01-31 | Disposition: A | Discharge status: Home / Self Care | Payer: BLUE CROSS/BLUE SHIELD | Source: Ambulatory Visit | Attending: Urology | Admitting: Urology

## 2017-01-31 ENCOUNTER — Ambulatory Visit (HOSPITAL_COMMUNITY): Payer: BLUE CROSS/BLUE SHIELD | Admitting: Anesthesiology

## 2017-01-31 ENCOUNTER — Encounter (HOSPITAL_COMMUNITY): Payer: Self-pay | Admitting: *Deleted

## 2017-01-31 DIAGNOSIS — E669 Obesity, unspecified: Secondary | ICD-10-CM | POA: Insufficient documentation

## 2017-01-31 DIAGNOSIS — G4730 Sleep apnea, unspecified: Secondary | ICD-10-CM | POA: Insufficient documentation

## 2017-01-31 DIAGNOSIS — N400 Enlarged prostate without lower urinary tract symptoms: Secondary | ICD-10-CM | POA: Insufficient documentation

## 2017-01-31 DIAGNOSIS — Z6841 Body Mass Index (BMI) 40.0 and over, adult: Secondary | ICD-10-CM | POA: Diagnosis not present

## 2017-01-31 DIAGNOSIS — G4733 Obstructive sleep apnea (adult) (pediatric): Secondary | ICD-10-CM | POA: Diagnosis not present

## 2017-01-31 DIAGNOSIS — I10 Essential (primary) hypertension: Secondary | ICD-10-CM | POA: Diagnosis not present

## 2017-01-31 DIAGNOSIS — N200 Calculus of kidney: Secondary | ICD-10-CM | POA: Insufficient documentation

## 2017-01-31 DIAGNOSIS — K274 Chronic or unspecified peptic ulcer, site unspecified, with hemorrhage: Secondary | ICD-10-CM | POA: Diagnosis not present

## 2017-01-31 HISTORY — PX: CYSTOSCOPY/URETEROSCOPY/HOLMIUM LASER/STENT PLACEMENT: SHX6546

## 2017-01-31 SURGERY — CYSTOSCOPY/URETEROSCOPY/HOLMIUM LASER/STENT PLACEMENT
Anesthesia: General | Site: Urethra | Laterality: Bilateral

## 2017-01-31 SURGERY — Surgical Case
Anesthesia: *Unknown

## 2017-01-31 MED ORDER — FENTANYL CITRATE (PF) 100 MCG/2ML IJ SOLN
INTRAMUSCULAR | Status: AC
  Filled 2017-01-31: qty 2

## 2017-01-31 MED ORDER — LIDOCAINE HCL 2 % EX GEL
CUTANEOUS | Status: AC
  Filled 2017-01-31: qty 5

## 2017-01-31 MED ORDER — ONDANSETRON HCL 4 MG/2ML IJ SOLN
INTRAMUSCULAR | Status: AC
  Filled 2017-01-31: qty 2

## 2017-01-31 MED ORDER — EPHEDRINE SULFATE-NACL 50-0.9 MG/10ML-% IV SOSY
PREFILLED_SYRINGE | INTRAVENOUS | Status: DC | PRN
  Administered 2017-01-31 (×3): 10 mg via INTRAVENOUS

## 2017-01-31 MED ORDER — OXYCODONE-ACETAMINOPHEN 5-325 MG PO TABS: 1.0000 | ORAL_TABLET | Freq: Four times a day (QID) | ORAL | 0 refills | Status: DC | PRN

## 2017-01-31 MED ORDER — SODIUM CHLORIDE 0.9 % IR SOLN
Status: DC | PRN
  Administered 2017-01-31: 4000 mL

## 2017-01-31 MED ORDER — LACTATED RINGERS IV SOLN
INTRAVENOUS | Status: DC | PRN
  Administered 2017-01-31 (×2): via INTRAVENOUS

## 2017-01-31 MED ORDER — EPHEDRINE 5 MG/ML INJ
INTRAVENOUS | Status: AC
  Filled 2017-01-31: qty 10

## 2017-01-31 MED ORDER — DEXAMETHASONE SODIUM PHOSPHATE 10 MG/ML IJ SOLN
INTRAMUSCULAR | Status: AC
  Filled 2017-01-31: qty 1

## 2017-01-31 MED ORDER — ONDANSETRON HCL 4 MG/2ML IJ SOLN
INTRAMUSCULAR | Status: DC | PRN
  Administered 2017-01-31: 4 mg via INTRAVENOUS

## 2017-01-31 MED ORDER — DEXAMETHASONE SODIUM PHOSPHATE 10 MG/ML IJ SOLN
INTRAMUSCULAR | Status: DC | PRN
  Administered 2017-01-31: 10 mg via INTRAVENOUS

## 2017-01-31 MED ORDER — IOHEXOL 300 MG/ML  SOLN
INTRAMUSCULAR | Status: DC | PRN
  Administered 2017-01-31: 30 mL via INTRAVENOUS

## 2017-01-31 MED ORDER — 0.9 % SODIUM CHLORIDE (POUR BTL) OPTIME
TOPICAL | Status: DC | PRN
  Administered 2017-01-31: 1000 mL

## 2017-01-31 MED ORDER — PROPOFOL 10 MG/ML IV BOLUS
INTRAVENOUS | Status: AC
  Filled 2017-01-31: qty 40

## 2017-01-31 MED ORDER — PROPOFOL 10 MG/ML IV BOLUS
INTRAVENOUS | Status: DC | PRN
  Administered 2017-01-31: 100 mg via INTRAVENOUS
  Administered 2017-01-31: 300 mg via INTRAVENOUS

## 2017-01-31 MED ORDER — FENTANYL CITRATE (PF) 100 MCG/2ML IJ SOLN
INTRAMUSCULAR | Status: DC | PRN
  Administered 2017-01-31 (×2): 50 ug via INTRAVENOUS
  Administered 2017-01-31: 100 ug via INTRAVENOUS
  Administered 2017-01-31 (×2): 50 ug via INTRAVENOUS

## 2017-01-31 MED ORDER — GENTAMICIN SULFATE 40 MG/ML IJ SOLN
5.0000 mg/kg | INTRAVENOUS | Status: DC
  Filled 2017-01-31: qty 13.5

## 2017-01-31 MED ORDER — LIDOCAINE 2% (20 MG/ML) 5 ML SYRINGE
INTRAMUSCULAR | Status: DC | PRN
  Administered 2017-01-31: 100 mg via INTRAVENOUS

## 2017-01-31 MED ORDER — SODIUM CHLORIDE 0.9 % IR SOLN
Status: DC | PRN
  Administered 2017-01-31: 3000 mL

## 2017-01-31 MED ORDER — MIDAZOLAM HCL 5 MG/5ML IJ SOLN
INTRAMUSCULAR | Status: DC | PRN
  Administered 2017-01-31: 2 mg via INTRAVENOUS

## 2017-01-31 MED ORDER — KETOROLAC TROMETHAMINE 10 MG PO TABS: 10.0000 mg | ORAL_TABLET | Freq: Four times a day (QID) | ORAL | 1 refills | Status: DC | PRN

## 2017-01-31 MED ORDER — MIDAZOLAM HCL 2 MG/2ML IJ SOLN
INTRAMUSCULAR | Status: AC
  Filled 2017-01-31: qty 2

## 2017-01-31 MED ORDER — PHENYLEPHRINE 40 MCG/ML (10ML) SYRINGE FOR IV PUSH (FOR BLOOD PRESSURE SUPPORT)
PREFILLED_SYRINGE | INTRAVENOUS | Status: DC | PRN
  Administered 2017-01-31 (×3): 80 ug via INTRAVENOUS

## 2017-01-31 MED ORDER — GENTAMICIN IN SALINE 1.6-0.9 MG/ML-% IV SOLN
80.0000 mg | INTRAVENOUS | Status: DC
Start: 2017-01-31 — End: 2017-01-31

## 2017-01-31 MED ORDER — SENNOSIDES-DOCUSATE SODIUM 8.6-50 MG PO TABS: 1.0000 | ORAL_TABLET | Freq: Two times a day (BID) | ORAL | 0 refills | Status: DC

## 2017-01-31 MED ORDER — PHENYLEPHRINE 40 MCG/ML (10ML) SYRINGE FOR IV PUSH (FOR BLOOD PRESSURE SUPPORT)
PREFILLED_SYRINGE | INTRAVENOUS | Status: AC
  Filled 2017-01-31: qty 10

## 2017-01-31 MED ORDER — HYDROMORPHONE HCL 1 MG/ML IJ SOLN: 0.2500 mg | INTRAMUSCULAR | Status: DC | PRN

## 2017-01-31 SURGICAL SUPPLY — 22 items
BAG URO CATCHER STRL LF (MISCELLANEOUS) ×2 IMPLANT
BASKET LASER NITINOL 1.9FR (BASKET) ×2 IMPLANT
CATH INTERMIT  6FR 70CM (CATHETERS) ×2 IMPLANT
CLOTH BEACON ORANGE TIMEOUT ST (SAFETY) ×2 IMPLANT
FIBER LASER FLEXIVA 1000 (UROLOGICAL SUPPLIES) IMPLANT
FIBER LASER FLEXIVA 365 (UROLOGICAL SUPPLIES) ×2 IMPLANT
FIBER LASER FLEXIVA 550 (UROLOGICAL SUPPLIES) IMPLANT
FIBER LASER TRAC TIP (UROLOGICAL SUPPLIES) ×2 IMPLANT
GLOVE BIOGEL M STRL SZ7.5 (GLOVE) ×2 IMPLANT
GOWN STRL REUS W/TWL LRG LVL3 (GOWN DISPOSABLE) ×4 IMPLANT
GUIDEWIRE ANG ZIPWIRE 038X150 (WIRE) ×4 IMPLANT
GUIDEWIRE STR DUAL SENSOR (WIRE) ×2 IMPLANT
HOVERMATT SINGLE USE (MISCELLANEOUS) ×2 IMPLANT
IV NS 1000ML (IV SOLUTION) ×1
IV NS 1000ML BAXH (IV SOLUTION) ×1 IMPLANT
MANIFOLD NEPTUNE II (INSTRUMENTS) ×2 IMPLANT
PACK CYSTO (CUSTOM PROCEDURE TRAY) ×2 IMPLANT
SHEATH ACCESS URETERAL 38CM (SHEATH) ×2 IMPLANT
STENT URET 6FRX26 CONTOUR (STENTS) ×4 IMPLANT
SYR CONTROL 10ML LL (SYRINGE) ×2 IMPLANT
TUBE FEEDING 8FR 16IN STR KANG (MISCELLANEOUS) ×2 IMPLANT
TUBING CONNECTING 10 (TUBING) ×2 IMPLANT

## 2017-01-31 NOTE — Transfer of Care (Signed)
Immediate Anesthesia Transfer of Care Note  Patient: Leslie Soto  Procedure(s) Performed: Procedure(s): CYSTOSCOPY/BILATERAL RETROGRADE PYELOGRAM/ LEFT URETEROSCOPYLEFT Dorene Ar LASER/BILATERAL STENT PLACEMENT (Bilateral)  Patient Location: PACU  Anesthesia Type:General  Level of Consciousness: sedated  Airway & Oxygen Therapy: Patient Spontanous Breathing and Patient connected to face mask oxygen  Post-op Assessment: Report given to RN and Post -op Vital signs reviewed and stable  Post vital signs: Reviewed and stable  Last Vitals:  Vitals:   01/31/17 0809  BP: 134/73  Pulse: 77  Resp: 20  Temp: 36.7 C    Last Pain:  Vitals:   01/31/17 0809  TempSrc: Oral      Patients Stated Pain Goal: 3 ([id number])  Complications: No apparent anesthesia complications

## 2017-01-31 NOTE — Anesthesia Procedure Notes (Signed)
Procedure Name: LMA Insertion Date/Time: 01/31/2017 10:11 AM Performed by: Lind Covert Pre-anesthesia Checklist: Patient identified, Emergency Drugs available, Suction available, Patient being monitored and Timeout performed Patient Re-evaluated:Patient Re-evaluated prior to inductionOxygen Delivery Method: Circle system utilized Preoxygenation: Pre-oxygenation with 100% oxygen Intubation Type: IV induction LMA: LMA inserted LMA Size: 5.0 Number of attempts: 1 Placement Confirmation: positive ETCO2 and breath sounds checked- equal and bilateral Tube secured with: Tape Dental Injury: Teeth and Oropharynx as per pre-operative assessment

## 2017-01-31 NOTE — Anesthesia Preprocedure Evaluation (Addendum)
Anesthesia Evaluation  Patient identified by MRN, date of birth, ID band Patient awake    Reviewed: Allergy & Precautions, NPO status , Patient's Chart, lab work & pertinent test results  Airway Mallampati: II  TM Distance: >3 FB     Dental   Pulmonary sleep apnea ,    breath sounds clear to auscultation       Cardiovascular hypertension,  Rhythm:Regular Rate:Normal     Neuro/Psych    GI/Hepatic Neg liver ROS, PUD,   Endo/Other  negative endocrine ROS  Renal/GU negative Renal ROS     Musculoskeletal  (+) Arthritis ,   Abdominal   Peds  Hematology  (+) anemia ,   Anesthesia Other Findings   Reproductive/Obstetrics                             Anesthesia Physical Anesthesia Plan  ASA: III  Anesthesia Plan: General   Post-op Pain Management:    Induction: Intravenous  Airway Management Planned: LMA  Additional Equipment:   Intra-op Plan:   Post-operative Plan: Extubation in OR  Informed Consent: I have reviewed the patients History and Physical, chart, labs and discussed the procedure including the risks, benefits and alternatives for the proposed anesthesia with the patient or authorized representative who has indicated his/her understanding and acceptance.   Dental advisory given  Plan Discussed with: CRNA and Anesthesiologist  Anesthesia Plan Comments:         Anesthesia Quick Evaluation

## 2017-01-31 NOTE — Brief Op Note (Signed)
01/31/2017  11:44 AM  PATIENT:  Leslie Soto  56 y.o. male  PRE-OPERATIVE DIAGNOSIS:  BILATERAL RENAL STONES  POST-OPERATIVE DIAGNOSIS:  BILATERAL RENAL STONES  PROCEDURE:  Procedure(s): CYSTOSCOPY/URETEROSCOPY/HOLMIUM LASER/STENT PLACEMENT (Bilateral)  SURGEON:  Surgeon(s) and Role:    * Alexis Frock, MD - Primary  PHYSICIAN ASSISTANT:   ASSISTANTS: none   ANESTHESIA:   general  EBL:  Total I/O In: 1000 [I.V.:1000] Out: -   BLOOD ADMINISTERED:none  DRAINS: none   LOCAL MEDICATIONS USED:  NONE  SPECIMEN:  Source of Specimen:  Left Renal Stone Fragments  DISPOSITION OF SPECIMEN:  [facility] for compositional analysis  COUNTS:  YES  TOURNIQUET:  * No tourniquets in log *  DICTATION: .Other Dictation: Dictation Number [id number]  PLAN OF CARE: Discharge to home after PACU  PATIENT DISPOSITION:  PACU - hemodynamically stable.   Delay start of Pharmacological VTE agent (>24hrs) due to surgical blood loss or risk of bleeding: yes

## 2017-01-31 NOTE — Discharge Instructions (Signed)
1 - You may have urinary urgency (bladder spasms) and bloody urine on / off with stent in place. This is normal. ° °2 - Call MD or go to ER for fever >102, severe pain / nausea / vomiting not relieved by medications, or acute change in medical status ° °

## 2017-01-31 NOTE — Anesthesia Postprocedure Evaluation (Signed)
Anesthesia Post Note  Patient: Leslie Soto  Procedure(s) Performed: Procedure(s) (LRB): CYSTOSCOPY/BILATERAL RETROGRADE PYELOGRAM/ LEFT URETEROSCOPYLEFT /HOLMIUM LASER/BILATERAL STENT PLACEMENT (Bilateral)  Anesthesia Type: General Level of consciousness: awake Pain management: pain level controlled Respiratory status: spontaneous breathing Cardiovascular status: stable Anesthetic complications: no       Last Vitals:  Vitals:   01/31/17 1231 01/31/17 1241  BP: 117/70 122/68  Pulse: 87 81  Resp: 17 17  Temp: 36.7 C 37.1 C    Last Pain:  Vitals:   01/31/17 1231  TempSrc:   PainSc: 0-No pain                 Ilyas Lipsitz

## 2017-01-31 NOTE — H&P (Signed)
Leslie Soto is an 56 y.o. male.    Chief Complaint: Pre-op 1 stage bilateral ureteroscopic stone manipulation  HPI:   1 - Recurrent Nephrolithiasis -   Pre 2018 - SWL x 3, URS x 1   01/2017 - CT Rt punctate x several, Lt mid 53m, Lower 669mx 2, ?ball-valving infundibular obstruction, no hyro.    2 - Medical Stone Disease -   Eval 2018: Composition - unknown; BMP,PTH,Urate,uPH - normal; 2440r Urines - pending,    PMH sig for obesity, inguinal hernia repair, OSA/CPAP. His PCP is BrThressa Sheller   Today "[place] seen to proceed with cysto bilateral 1st stage ureteroscopic stone manipulation. NO interval fevers, most recent UA without infectious parameters.    Past Medical History:  Diagnosis Date  . Degenerative disc disease    ?  . GI bleed due to NSAIDs   . History of kidney stones   . Hypertension   . Lower back pain   . Obesity (BMI 35.0-39.9 without comorbidity)   . OSA (obstructive sleep apnea)    Dr. StVertell LimberThomas-uses BIPAP at night  . Spina bifida (HCTalala   ?    Past Surgical History:  Procedure Laterality Date  . CARDIOVASCULAR STRESS TEST  06/12/2012   Normal pattern of perfusion in all regions, no ECG changes, EKG negative for ischemia.  . Marland KitchenSOPHAGOGASTRODUODENOSCOPY  08/28/2012   Procedure: ESOPHAGOGASTRODUODENOSCOPY (EGD);  Surgeon: RoInda CastleMD;  [facility] Service: Endoscopy;  Laterality: N/A;  . EXTRACORPOREAL SHOCK WAVE LITHOTRIPSY     2003  . EYE SURGERY     1969  . HERNIA REPAIR  09/20/1983   87, 84  . HERNIA REPAIR  09/25/1997  . KNEE ARTHROSCOPY  01/20/2011  . TRANSTHORACIC ECHOCARDIOGRAM  04/03/2013   EF 55-60%, moderate concentric hypertrophy,     Family History  Problem Relation Age of Onset  . Mental illness Father   . Heart disease Father   . Diabetes Father   . Hypertension Father   . Stroke Mother   . Hypertension Mother   . Hypertension Brother    Social History:  reports that he has never smoked. He  has never used smokeless tobacco. He reports that he does not drink alcohol or use drugs.  Allergies: No Known Allergies  No prescriptions prior to admission.    Results for orders placed or performed during the hospital encounter of 01/30/17 (from the past 48 hour(s))  Basic metabolic panel     Status: Abnormal   Collection Time: 01/30/17  8:27 AM  Result Value Ref Range   Sodium 139 135 - 145 mmol/L   Potassium 3.9 3.5 - 5.1 mmol/L   Chloride 108 101 - 111 mmol/L   CO2 21 (L) 22 - 32 mmol/L   Glucose, Bld 111 (H) 65 - 99 mg/dL   BUN 15 6 - 20 mg/dL   Creatinine, Ser 0.74 0.61 - 1.24 mg/dL   Calcium 9.6 8.9 - 10.3 mg/dL   GFR calc non Af Amer >60 >60 mL/min   GFR calc Af Amer >60 >60 mL/min    Comment: (NOTE) The eGFR has been calculated using the CKD EPI equation. This calculation has not been validated in all clinical situations. eGFR's persistently <60 mL/min signify possible Chronic Kidney Disease.    Anion gap 10 5 - 15  CBC     Status: Abnormal   Collection Time: 01/30/17  8:27 AM  Result Value Ref Range  WBC 11.4 (H) 4.0 - 10.5 K/uL   RBC 5.34 4.22 - 5.81 MIL/uL   Hemoglobin 16.9 13.0 - 17.0 g/dL   HCT 47.8 39.0 - 52.0 %   MCV 89.5 78.0 - 100.0 fL   MCH 31.6 26.0 - 34.0 pg   MCHC 35.4 30.0 - 36.0 g/dL   RDW 14.4 11.5 - 15.5 %   Platelets 207 150 - 400 K/uL   No results found.  Review of Systems  Constitutional: Negative.  Negative for chills and fever.  HENT: Negative.   Eyes: Negative.   Respiratory: Negative.   Cardiovascular: Negative.   Gastrointestinal: Negative.   Genitourinary: Positive for flank pain.  Skin: Negative.   Neurological: Negative.   Endo/Heme/Allergies: Negative.   Psychiatric/Behavioral: Negative.     There were no vitals taken for this visit. Physical Exam  Constitutional: He is oriented to person, place, and time. He appears well-developed.  HENT:  Head: Normocephalic.  Eyes: Pupils are equal, round, and reactive to light.   Neck: Normal range of motion.  Cardiovascular: Normal rate.   Respiratory: Effort normal.  GI: Soft.  Morbid truncal obesity  Genitourinary:  Genitourinary Comments: Mild left CVAT  Musculoskeletal: Normal range of motion.  Neurological: He is alert and oriented to person, place, and time.  Skin: Skin is warm.  Psychiatric: He has a normal mood and affect.     Assessment/Plan  Proceed as planned with 1st stage surgery today, mostly focusing on left intra-renal stones. Overall goal is to try to render stone free ureteroscopically then aggressive medical management to slow / prevent recurrence.   Alexis Frock, MD 01/31/2017, 7:32 AM

## 2017-01-31 NOTE — Brief Op Note (Signed)
01/31/2017  11:51 AM  PATIENT:  [name]  56 y.o. male  PRE-OPERATIVE DIAGNOSIS:  BILATERAL RENAL STONES  POST-OPERATIVE DIAGNOSIS:  BILATERAL RENAL STONES  PROCEDURE:  Procedure(s): CYSTOSCOPY/BILATERAL RETROGRADE PYELOGRAM/ LEFT URETEROSCOPYLEFT /HOLMIUM LASER/BILATERAL STENT PLACEMENT (Bilateral)  SURGEON:  Surgeon(s) and Role:    * Alexis Frock, MD - Primary  PHYSICIAN ASSISTANT:   ASSISTANTS: none   ANESTHESIA:   general  EBL:  Total I/O In: 1000 [I.V.:1000] Out: -   BLOOD ADMINISTERED:none  DRAINS: none   LOCAL MEDICATIONS USED:  NONE  SPECIMEN:  Source of Specimen:  Left renal stone fragments  DISPOSITION OF SPECIMEN:  [facility] for compositional analysis  COUNTS:  YES  TOURNIQUET:  * No tourniquets in log *  DICTATION: .Other Dictation: Dictation Number as per above  PLAN OF CARE: Discharge to home after PACU  PATIENT DISPOSITION:  PACU - hemodynamically stable.   Delay start of Pharmacological VTE agent (>24hrs) due to surgical blood loss or risk of bleeding: yes

## 2017-02-01 ENCOUNTER — Encounter (HOSPITAL_COMMUNITY): Payer: Self-pay | Admitting: Urology

## 2017-02-01 NOTE — Op Note (Signed)
Leslie Soto NO.:  [id number]  MEDICAL RECORD NO.:  [id number]  LOCATION:  [facility]                         FACILITY:  [facility]  PHYSICIAN:  Alexis Frock, MD     DATE OF BIRTH:  [birth year]  DATE OF PROCEDURE:  01/31/2017                              OPERATIVE REPORT   DIAGNOSIS:  Left greater than right large volume recurrent renal stones with intermittent left flank pain.  PROCEDURE: 1. Cystoscopy, bilateral retrograde pyelogram interpretation. 2. Insertion of bilateral ureteral stents, 6 x 26 Contour, no tether. 3. Left first stage ureteroscopy, laser lithotripsy.  ESTIMATED BLOOD LOSS:  Nil.  COMPLICATION:  None.  SPECIMEN:  Left renal stone fragments compositional analysis.  FINDINGS: 1. Unremarkable bladder and urethra. 2. Unremarkable right retrograde pyelogram. 3. Large volume multifocal left renal stone, left UPJ stone likely     intermittent obstruction. 4. Resolution of approximately 60-70% of left-sided stone volume with     laser lithotripsy and basket extraction today. 5. Successful placement of bilateral ureteral stents, proximal in     upper pole, distal in urinary bladder.  INDICATION:  Leslie Soto is a pleasant 55 year old gentleman with history of extreme obesity as well as some recurrent nephrolithiasis. He has had multiple treatment approaches in the past, who presented with intermittent left flank pain.  CT imaging was found to have a large volume left intrarenal stone.  The dominant UPJ stone with likely intermittent obstruction as well as multifocal lower pole stone and several smaller stones on the right, nonobstructing.  Options were discussed for management.  The patient wished to proceed with a very aggressive pass to achieve stone free status, and on metabolic evaluation, an eventual medical therapy to help reduce recurrence. Given his current stone volume, options were discussed for management including staged  ureteroscopy versus combination of ureteroscopic and percutaneous approach and we both agreed that staged ureteroscopy would be the safest way to proceed.  Informed consent was obtained and placed in the medical record.  PROCEDURE IN DETAIL:  The patient being Leslie Soto and first-stage bilateral ureteroscopic stone manipulation was confirmed.  Procedure was carried and time-out was performed.  Intravenous antibiotics administered.  General LMA anesthesia introduced.  The patient placed into a low lithotomy position.  Sterile field was created by prepping and draping the patient's penis, perineum, proximal thighs using iodine. Next, cystourethroscopy was performed using a the rigid cystoscope with offset lens.  Inspection of the anterior and posterior urethra unremarkable.  Inspection of urinary bladder revealed no diverticula, calcifications, papillary lesions.  There was some moderate trilobar prostatic hypertrophy with a small intravesical median lobe.  The right ureteral orifice was cannulated with a 6-French end-hole catheter and right retrograde pyelogram was obtained.  Right retrograde pyelogram demonstrated a single right ureter with single-system right kidney.  No filling defects or narrowing noted.  A 0.038 Zip wire was advanced to the upper pole, set aside as a safety wire.  Similarly left retrograde pyelogram was obtained.  Left retrograde pyelogram demonstrated single left ureter with single- system left kidney.  There were multifocal densities turned filling defects including an apparent ball valving UPJ stone likely with intermittent obstruction.  A  separate 0.038 Zip wire was advanced to the lower pole, set aside as a safety wire.  An 8-French feeding tube placed in the urinary bladder for pressure release.  A semi-rigid ureteroscopy was then performed of the left ureter allowing inspection of the distal four-fifths of the ureter alongside a separate sensor working  wire.  No mucosal abnormalities were found.  The semi-rigid scope was exchanged for a 12/14 ureteral access sheath at the level of proximal ureter using continuous fluoroscopic guidance and flexible digital ureteroscopy performed of the proximal left ureter.  Systematic inspection of the left kidney including all calices x3.  As expected, there was a very dominant large UPJ renal pelvis stone.  It was much much too large for simple basketing.  This did have an appearance of likely intermittent obstruction.  There was multifocal lower pole stone as well as some papillary tip calcifications in the upper pole.  As such, holmium laser energy applied to the stone using settings of 0.3 joules and 50 hertz in a dusting technique was used to ablate approximately an estimated 50% of the stone volume.  The remainder of stone volume was carefully fragmented into pieces, estimated to be 1 mm to 2 mm in diameter.  An "Escape" basket was used and sequential basketing of these fragments was performed removing dominant retrievable fragments is estimated approximately 60 to 70% of total stone volume was able to be removed today.  The remaining stone volume being quite small individually.  It is probably felt we achieved the goals, the first stage of the procedure today.  The interval stenting would be warranted to allow hopeful passage of a significant amount of the small left-sided stone and allowed pre stenting for ureteroscopy on the right side.  As such, the ureteroscope was removed as was the sheath under continuous ureteroscopic vision.  No mucosal abnormalities were found.  A new 6 x 26 Contour-type stent was then placed bilaterally over the respective safety wire using cystoscopic guidance and fluoroscopic guidance proximal and upper pole distal in urinary bladder.  Bladder was emptied per cystoscope, procedure was then terminated.  The patient tolerated procedure well.  There were no immediate  periprocedural complications. The patient was taken to the postanesthesia care unit in stable condition.          ______________________________ Alexis Frock, MD     TM/MEDQ  D:  01/31/2017  T:  01/31/2017  Job:  [id number]

## 2017-02-05 DIAGNOSIS — I10 Essential (primary) hypertension: Secondary | ICD-10-CM | POA: Diagnosis not present

## 2017-02-05 DIAGNOSIS — Z0001 Encounter for general adult medical examination with abnormal findings: Secondary | ICD-10-CM | POA: Diagnosis not present

## 2017-02-05 DIAGNOSIS — E785 Hyperlipidemia, unspecified: Secondary | ICD-10-CM | POA: Diagnosis not present

## 2017-02-09 ENCOUNTER — Encounter (HOSPITAL_COMMUNITY): Payer: Self-pay | Admitting: *Deleted

## 2017-02-15 DIAGNOSIS — G4733 Obstructive sleep apnea (adult) (pediatric): Secondary | ICD-10-CM | POA: Diagnosis not present

## 2017-02-20 ENCOUNTER — Other Ambulatory Visit: Payer: Self-pay | Admitting: Urology

## 2017-02-21 ENCOUNTER — Encounter (HOSPITAL_COMMUNITY): Payer: Self-pay

## 2017-02-21 ENCOUNTER — Ambulatory Visit (HOSPITAL_COMMUNITY)
Admission: RE | Admit: 2017-02-21 | Discharge: 2017-02-21 | Disposition: A | Discharge status: Home / Self Care | Payer: BLUE CROSS/BLUE SHIELD | Source: Ambulatory Visit | Attending: Urology | Admitting: Urology

## 2017-02-21 ENCOUNTER — Ambulatory Visit (HOSPITAL_COMMUNITY): Payer: BLUE CROSS/BLUE SHIELD | Admitting: Anesthesiology

## 2017-02-21 ENCOUNTER — Encounter (HOSPITAL_COMMUNITY)
Admission: RE | Disposition: A | Discharge status: Home / Self Care | Payer: Self-pay | Source: Ambulatory Visit | Attending: Urology

## 2017-02-21 DIAGNOSIS — Z6841 Body Mass Index (BMI) 40.0 and over, adult: Secondary | ICD-10-CM | POA: Diagnosis not present

## 2017-02-21 DIAGNOSIS — M1991 Primary osteoarthritis, unspecified site: Secondary | ICD-10-CM | POA: Insufficient documentation

## 2017-02-21 DIAGNOSIS — N200 Calculus of kidney: Secondary | ICD-10-CM | POA: Insufficient documentation

## 2017-02-21 DIAGNOSIS — G4733 Obstructive sleep apnea (adult) (pediatric): Secondary | ICD-10-CM | POA: Insufficient documentation

## 2017-02-21 DIAGNOSIS — Z823 Family history of stroke: Secondary | ICD-10-CM | POA: Diagnosis not present

## 2017-02-21 DIAGNOSIS — E669 Obesity, unspecified: Secondary | ICD-10-CM | POA: Diagnosis not present

## 2017-02-21 DIAGNOSIS — Z818 Family history of other mental and behavioral disorders: Secondary | ICD-10-CM | POA: Insufficient documentation

## 2017-02-21 DIAGNOSIS — Z833 Family history of diabetes mellitus: Secondary | ICD-10-CM | POA: Diagnosis not present

## 2017-02-21 DIAGNOSIS — Z79899 Other long term (current) drug therapy: Secondary | ICD-10-CM | POA: Insufficient documentation

## 2017-02-21 DIAGNOSIS — I10 Essential (primary) hypertension: Secondary | ICD-10-CM | POA: Diagnosis not present

## 2017-02-21 DIAGNOSIS — Z8249 Family history of ischemic heart disease and other diseases of the circulatory system: Secondary | ICD-10-CM | POA: Diagnosis not present

## 2017-02-21 DIAGNOSIS — Z466 Encounter for fitting and adjustment of urinary device: Secondary | ICD-10-CM | POA: Diagnosis not present

## 2017-02-21 DIAGNOSIS — Z87442 Personal history of urinary calculi: Secondary | ICD-10-CM | POA: Diagnosis not present

## 2017-02-21 DIAGNOSIS — Q059 Spina bifida, unspecified: Secondary | ICD-10-CM | POA: Insufficient documentation

## 2017-02-21 HISTORY — PX: CYSTOSCOPY/URETEROSCOPY/HOLMIUM LASER/STENT PLACEMENT: SHX6546

## 2017-02-21 LAB — CBC
HCT: 43.3 % (ref 39.0–52.0)
Hemoglobin: 15 g/dL (ref 13.0–17.0)
MCH: 31.1 pg (ref 26.0–34.0)
MCHC: 34.6 g/dL (ref 30.0–36.0)
MCV: 89.8 fL (ref 78.0–100.0)
PLATELETS: 221 10*3/uL (ref 150–400)
RBC: 4.82 MIL/uL (ref 4.22–5.81)
RDW: 14.2 % (ref 11.5–15.5)
WBC: 12.4 10*3/uL — ABNORMAL HIGH (ref 4.0–10.5)

## 2017-02-21 LAB — BASIC METABOLIC PANEL
Anion gap: 8 (ref 5–15)
BUN: 17 mg/dL (ref 6–20)
CALCIUM: 8.9 mg/dL (ref 8.9–10.3)
CHLORIDE: 108 mmol/L (ref 101–111)
CO2: 22 mmol/L (ref 22–32)
CREATININE: 0.97 mg/dL (ref 0.61–1.24)
GFR calc Af Amer: >60 mL/min (ref >60)
GFR calc non Af Amer: >60 mL/min (ref >60)
GLUCOSE: 101 mg/dL — AB (ref 65–99)
Potassium: 4.1 mmol/L (ref 3.5–5.1)
Sodium: 138 mmol/L (ref 135–145)

## 2017-02-21 SURGERY — CYSTOSCOPY/URETEROSCOPY/HOLMIUM LASER/STENT PLACEMENT
Anesthesia: General | Laterality: Bilateral

## 2017-02-21 MED ORDER — LIDOCAINE 2% (20 MG/ML) 5 ML SYRINGE
INTRAMUSCULAR | Status: AC
  Filled 2017-02-21: qty 5

## 2017-02-21 MED ORDER — SODIUM CHLORIDE 0.9 % IR SOLN
Status: DC | PRN
  Administered 2017-02-21 (×3): 1000 mL

## 2017-02-21 MED ORDER — PROMETHAZINE HCL 25 MG/ML IJ SOLN
INTRAMUSCULAR | Status: AC
  Filled 2017-02-21: qty 1

## 2017-02-21 MED ORDER — SODIUM CHLORIDE 0.9 % IR SOLN
Status: DC | PRN
  Administered 2017-02-21: 1000 mL

## 2017-02-21 MED ORDER — FENTANYL CITRATE (PF) 100 MCG/2ML IJ SOLN
INTRAMUSCULAR | Status: AC
  Filled 2017-02-21: qty 2

## 2017-02-21 MED ORDER — CEPHALEXIN 500 MG PO CAPS: 500.0000 mg | ORAL_CAPSULE | Freq: Two times a day (BID) | ORAL | 0 refills | Status: DC

## 2017-02-21 MED ORDER — PROMETHAZINE HCL 25 MG/ML IJ SOLN: 6.2500 mg | INTRAMUSCULAR | Status: DC | PRN

## 2017-02-21 MED ORDER — SODIUM CHLORIDE 0.9 % IR SOLN
Status: DC | PRN
  Administered 2017-02-21: 3000 mL

## 2017-02-21 MED ORDER — MEPERIDINE HCL 50 MG/ML IJ SOLN: 6.2500 mg | INTRAMUSCULAR | Status: DC | PRN

## 2017-02-21 MED ORDER — MIDAZOLAM HCL 2 MG/2ML IJ SOLN
INTRAMUSCULAR | Status: AC
  Filled 2017-02-21: qty 2

## 2017-02-21 MED ORDER — HYDROMORPHONE HCL 1 MG/ML IJ SOLN
0.2500 mg | INTRAMUSCULAR | Status: DC | PRN
  Administered 2017-02-21: 0.5 mg via INTRAVENOUS

## 2017-02-21 MED ORDER — IOPAMIDOL (ISOVUE-300) INJECTION 61%
INTRAVENOUS | Status: DC | PRN
  Administered 2017-02-21: 50 mL

## 2017-02-21 MED ORDER — EPHEDRINE SULFATE-NACL 50-0.9 MG/10ML-% IV SOSY
PREFILLED_SYRINGE | INTRAVENOUS | Status: DC | PRN
  Administered 2017-02-21 (×2): 10 mg via INTRAVENOUS

## 2017-02-21 MED ORDER — OXYCODONE-ACETAMINOPHEN 5-325 MG PO TABS: 1.0000 | ORAL_TABLET | Freq: Four times a day (QID) | ORAL | 0 refills | Status: DC | PRN

## 2017-02-21 MED ORDER — FENTANYL CITRATE (PF) 100 MCG/2ML IJ SOLN
INTRAMUSCULAR | Status: DC | PRN
  Administered 2017-02-21 (×3): 50 ug via INTRAVENOUS

## 2017-02-21 MED ORDER — ONDANSETRON HCL 4 MG/2ML IJ SOLN
INTRAMUSCULAR | Status: DC | PRN
  Administered 2017-02-21: 4 mg via INTRAVENOUS

## 2017-02-21 MED ORDER — PROPOFOL 10 MG/ML IV BOLUS
INTRAVENOUS | Status: DC | PRN
  Administered 2017-02-21: 300 mg via INTRAVENOUS

## 2017-02-21 MED ORDER — KETOROLAC TROMETHAMINE 10 MG PO TABS: 10.0000 mg | ORAL_TABLET | Freq: Four times a day (QID) | ORAL | 1 refills | Status: DC | PRN

## 2017-02-21 MED ORDER — PHENYLEPHRINE 40 MCG/ML (10ML) SYRINGE FOR IV PUSH (FOR BLOOD PRESSURE SUPPORT)
PREFILLED_SYRINGE | INTRAVENOUS | Status: AC
  Filled 2017-02-21: qty 10

## 2017-02-21 MED ORDER — HYDROMORPHONE HCL 1 MG/ML IJ SOLN
INTRAMUSCULAR | Status: AC
  Filled 2017-02-21: qty 1

## 2017-02-21 MED ORDER — EPHEDRINE 5 MG/ML INJ
INTRAVENOUS | Status: AC
  Filled 2017-02-21: qty 10

## 2017-02-21 MED ORDER — LACTATED RINGERS IV SOLN
INTRAVENOUS | Status: DC
  Administered 2017-02-21 (×2): via INTRAVENOUS

## 2017-02-21 MED ORDER — LIDOCAINE 2% (20 MG/ML) 5 ML SYRINGE
INTRAMUSCULAR | Status: DC | PRN
  Administered 2017-02-21: 100 mg via INTRAVENOUS

## 2017-02-21 MED ORDER — PHENYLEPHRINE 40 MCG/ML (10ML) SYRINGE FOR IV PUSH (FOR BLOOD PRESSURE SUPPORT)
PREFILLED_SYRINGE | INTRAVENOUS | Status: DC | PRN
  Administered 2017-02-21 (×4): 80 ug via INTRAVENOUS

## 2017-02-21 MED ORDER — PROPOFOL 10 MG/ML IV BOLUS
INTRAVENOUS | Status: AC
  Filled 2017-02-21: qty 40

## 2017-02-21 MED ORDER — DEXAMETHASONE SODIUM PHOSPHATE 10 MG/ML IJ SOLN
INTRAMUSCULAR | Status: DC | PRN
  Administered 2017-02-21: 10 mg via INTRAVENOUS

## 2017-02-21 MED ORDER — GENTAMICIN SULFATE 40 MG/ML IJ SOLN
520.0000 mg | Freq: Once | INTRAVENOUS | Status: AC
  Administered 2017-02-21: 520 mg via INTRAVENOUS
  Filled 2017-02-21: qty 13

## 2017-02-21 SURGICAL SUPPLY — 23 items
BAG URO CATCHER STRL LF (MISCELLANEOUS) ×2 IMPLANT
BASKET LASER NITINOL 1.9FR (BASKET) ×2 IMPLANT
BASKET STONE NCOMPASS (UROLOGICAL SUPPLIES) ×2 IMPLANT
CLOTH BEACON ORANGE TIMEOUT ST (SAFETY) ×2 IMPLANT
FIBER LASER FLEXIVA 1000 (UROLOGICAL SUPPLIES) ×2 IMPLANT
FIBER LASER FLEXIVA 365 (UROLOGICAL SUPPLIES) IMPLANT
FIBER LASER FLEXIVA 550 (UROLOGICAL SUPPLIES) IMPLANT
FIBER LASER TRAC TIP (UROLOGICAL SUPPLIES) IMPLANT
GLOVE BIOGEL M STRL SZ7.5 (GLOVE) ×2 IMPLANT
GLOVE BIOGEL PI IND STRL 7.5 (GLOVE) ×2 IMPLANT
GLOVE BIOGEL PI INDICATOR 7.5 (GLOVE) ×2
GOWN STRL REUS W/TWL LRG LVL3 (GOWN DISPOSABLE) ×2 IMPLANT
GUIDEWIRE ANG ZIPWIRE 038X150 (WIRE) ×4 IMPLANT
GUIDEWIRE STR DUAL SENSOR (WIRE) ×2 IMPLANT
IV NS 1000ML (IV SOLUTION) ×1
IV NS 1000ML BAXH (IV SOLUTION) ×1 IMPLANT
MANIFOLD NEPTUNE II (INSTRUMENTS) ×2 IMPLANT
PACK CYSTO (CUSTOM PROCEDURE TRAY) ×2 IMPLANT
SHEATH ACCESS URETERAL 38CM (SHEATH) ×2 IMPLANT
STENT URET 6FRX26 CONTOUR (STENTS) ×4 IMPLANT
SYR CONTROL 10ML LL (SYRINGE) ×2 IMPLANT
TUBE FEEDING 8FR 16IN STR KANG (MISCELLANEOUS) ×2 IMPLANT
TUBING CONNECTING 10 (TUBING) ×2 IMPLANT

## 2017-02-21 NOTE — Anesthesia Procedure Notes (Signed)
Procedure Name: LMA Insertion Date/Time: 02/21/2017 12:11 PM Performed by: Danley Danker L Patient Re-evaluated:Patient Re-evaluated prior to inductionOxygen Delivery Method: Circle system utilized Preoxygenation: Pre-oxygenation with 100% oxygen Intubation Type: IV induction Ventilation: Mask ventilation without difficulty LMA: LMA inserted LMA Size: 5.0 Number of attempts: 1 Placement Confirmation: positive ETCO2 and breath sounds checked- equal and bilateral Tube secured with: Tape Dental Injury: Teeth and Oropharynx as per pre-operative assessment

## 2017-02-21 NOTE — Transfer of Care (Signed)
Immediate Anesthesia Transfer of Care Note  Patient: Leslie Soto  Procedure(s) Performed: Procedure(s): CYSTOSCOPY/URETEROSCOPY/HOLMIUM LASER/STENT REPLACEMENT (Bilateral)  Patient Location: PACU  Anesthesia Type:General  Level of Consciousness: awake and alert   Airway & Oxygen Therapy: Patient Spontanous Breathing and Patient connected to face mask oxygen  Post-op Assessment: Report given to RN and Post -op Vital signs reviewed and stable  Post vital signs: Reviewed and stable  Last Vitals:  Vitals:   02/21/17 0907  BP: (!) 157/90  Pulse: 88  Resp: 20  Temp: 36.4 C    Last Pain:  Vitals:   02/21/17 0934  TempSrc:   PainSc: 5       Patients Stated Pain Goal: 5 (69/45/03 8882)  Complications: No apparent anesthesia complications

## 2017-02-21 NOTE — Anesthesia Preprocedure Evaluation (Signed)
Anesthesia Evaluation  Patient identified by MRN, date of birth, ID band Patient awake    Reviewed: Allergy & Precautions, NPO status , Patient's Chart, lab work & pertinent test results  Airway Mallampati: II  TM Distance: >3 FB     Dental   Pulmonary sleep apnea ,    breath sounds clear to auscultation       Cardiovascular hypertension,  Rhythm:Regular Rate:Normal     Neuro/Psych    GI/Hepatic Neg liver ROS, PUD,   Endo/Other  negative endocrine ROS  Renal/GU negative Renal ROS     Musculoskeletal  (+) Arthritis ,   Abdominal   Peds  Hematology  (+) anemia ,   Anesthesia Other Findings   Reproductive/Obstetrics                             Anesthesia Physical  Anesthesia Plan  ASA: III  Anesthesia Plan: General   Post-op Pain Management:    Induction: Intravenous  Airway Management Planned: LMA  Additional Equipment:   Intra-op Plan:   Post-operative Plan: Extubation in OR  Informed Consent: I have reviewed the patients History and Physical, chart, labs and discussed the procedure including the risks, benefits and alternatives for the proposed anesthesia with the patient or authorized representative who has indicated his/her understanding and acceptance.   Dental advisory given  Plan Discussed with: CRNA  Anesthesia Plan Comments:         Anesthesia Quick Evaluation

## 2017-02-21 NOTE — H&P (Signed)
Leslie Soto is an 56 y.o. male.    Chief Complaint: Pre-op 2nd stage BILATERAL Ureteroscopic Stone Manipulaiton  HPI:   1 - Recurrent Nephrolithiasis -   Pre 2018 - SWL x 3, URS x 1   01/2017 - CT Rt punctate x several, Lt mid 76mm, Lower 73mm x 2, ?ball-valving infundibular obstruction, no hyro. ==> 1st stage bilateral ureteroscopic stone manipulation 01/31/17.    2 - Medical Stone Disease -   Eval 2018: Composition - unknown; BMP,PTH,Urate,uPH - normal; 25 Hr Urines - pending,    PMH sig for obesity, inguinal hernia repair, OSA/CPAP. His PCP is Thressa Sheller.    Today "Leslie Soto" is seen to proceed with cysto bilateral 2ndstage ureteroscopic stone manipulation with goal of stone free. NO interval fevers, most recent UA without infectious parameters. He has had substantial stent colic.    Past Medical History:  Diagnosis Date  . Degenerative disc disease    ?  . GI bleed due to NSAIDs   . History of kidney stones   . Hypertension   . Lower back pain   . Obesity (BMI 35.0-39.9 without comorbidity)   . OSA (obstructive sleep apnea)    Dr. Vertell Limber, [place]-uses BIPAP at night  . Spina bifida ([place])    ?    Past Surgical History:  Procedure Laterality Date  . CARDIOVASCULAR STRESS TEST  06/12/2012   Normal pattern of perfusion in all regions, no ECG changes, EKG negative for ischemia.  . CYSTOSCOPY/URETEROSCOPY/HOLMIUM LASER/STENT PLACEMENT Bilateral 01/31/2017   Procedure: CYSTOSCOPY/BILATERAL RETROGRADE PYELOGRAM/ LEFT URETEROSCOPYLEFT /HOLMIUM LASER/BILATERAL STENT PLACEMENT;  Surgeon: Alexis Frock, MD;  Location: [facility];  Service: Urology;  Laterality: Bilateral;  . ESOPHAGOGASTRODUODENOSCOPY  08/28/2012   Procedure: ESOPHAGOGASTRODUODENOSCOPY (EGD);  Surgeon: Inda Castle, MD;  Location: [place];  Service: Endoscopy;  Laterality: N/A;  . EXTRACORPOREAL SHOCK WAVE LITHOTRIPSY     2003  . EYE SURGERY     1969  . HERNIA REPAIR  09/20/1983   87, 86  . HERNIA REPAIR   09/25/1997  . KNEE ARTHROSCOPY  01/20/2011  . TRANSTHORACIC ECHOCARDIOGRAM  04/03/2013   EF 55-60%, moderate concentric hypertrophy,     Family History  Problem Relation Age of Onset  . Mental illness Father   . Heart disease Father   . Diabetes Father   . Hypertension Father   . Stroke Mother   . Hypertension Mother   . Hypertension Brother    Social History:  reports that he has never smoked. He has never used smokeless tobacco. He reports that he does not drink alcohol or use drugs.  Allergies: No Known Allergies  No prescriptions prior to admission.    No results found for this or any previous visit (from the past 48 hour(s)). No results found.  Review of Systems  Constitutional: Negative.  Negative for chills and fever.  HENT: Negative.   Eyes: Negative.   Respiratory: Negative.   Cardiovascular: Negative.   Gastrointestinal: Negative.   Genitourinary: Positive for frequency and urgency.  Musculoskeletal: Negative.   Skin: Negative.   Neurological: Negative.   Endo/Heme/Allergies: Negative.   Psychiatric/Behavioral: Negative.     There were no vitals taken for this visit. Physical Exam  Constitutional: He is oriented to person, place, and time. He appears well-developed.  HENT:  Head: Normocephalic.  Eyes: Pupils are equal, round, and reactive to light.  Neck: Normal range of motion.  Cardiovascular: Normal rate.   Respiratory: Effort normal.  GI: Soft.  Stable morbid truncal  obesity.  Genitourinary:  Genitourinary Comments: NO CVAT  Musculoskeletal: Normal range of motion.  Neurological: He is alert and oriented to person, place, and time.  Skin: Skin is warm.  Psychiatric: He has a normal mood and affect. His behavior is normal. Judgment and thought content normal.     Assessment/Plan  Proceed as planned with BILATERAL 2nd stage ureteroscopy / stent exchange today as planned. Risks, beneftis, alternatives, expected peri-op course, need for  continued temporary stents (since bilateral procedure) discussed previously and reiterated today.   Alexis Frock, MD 02/21/2017, 6:11 AM

## 2017-02-21 NOTE — Brief Op Note (Signed)
02/21/2017  1:37 PM  PATIENT:  Leslie Soto  56 y.o. male  PRE-OPERATIVE DIAGNOSIS:  BILATERAL RENAL STONES  POST-OPERATIVE DIAGNOSIS:  BILATERAL RENAL STONES  PROCEDURE:  Procedure(s): CYSTOSCOPY/URETEROSCOPY/HOLMIUM LASER/STENT REPLACEMENT (Bilateral)  SURGEON:  Surgeon(s) and Role:    * Alexis Frock, MD - Primary  PHYSICIAN ASSISTANT:   ASSISTANTS: none   ANESTHESIA:   general  EBL:  Total I/O In: 1000 [I.V.:1000] Out: -   BLOOD ADMINISTERED:none  DRAINS: none   LOCAL MEDICATIONS USED:  NONE  SPECIMEN:  Source of Specimen:  bilateraal renal stone fragments  DISPOSITION OF SPECIMEN:  given to patient  COUNTS:  YES  TOURNIQUET:  * No tourniquets in log *  DICTATION: .Other Dictation: Dictation Number [id number]  PLAN OF CARE: Discharge to home after PACU  PATIENT DISPOSITION:  PACU - hemodynamically stable.   Delay start of Pharmacological VTE agent (>24hrs) due to surgical blood loss or risk of bleeding: yes

## 2017-02-21 NOTE — Anesthesia Postprocedure Evaluation (Signed)
Anesthesia Post Note  Patient: Leslie Soto  Procedure(s) Performed: Procedure(s) (LRB): CYSTOSCOPY/URETEROSCOPY/HOLMIUM LASER/STENT REPLACEMENT BILATERAL RETROGRADE (Bilateral)  Patient location during evaluation: PACU Anesthesia Type: General Level of consciousness: sedated and patient cooperative Pain management: pain level controlled Vital Signs Assessment: post-procedure vital signs reviewed and stable Respiratory status: spontaneous breathing Cardiovascular status: stable Anesthetic complications: no       Last Vitals:  Vitals:   02/21/17 1430 02/21/17 1459  BP: 122/72 116/75  Pulse: 81 94  Resp: (!) 21 15  Temp: 37.1 C 36.9 C    Last Pain:  Vitals:   02/21/17 1430  TempSrc:   PainSc: [place]

## 2017-02-21 NOTE — Discharge Instructions (Signed)
1 - You may have urinary urgency (bladder spasms) and bloody urine on / off with stent in place. This is normal. ° °2 - Call MD or go to ER for fever >102, severe pain / nausea / vomiting not relieved by medications, or acute change in medical status ° °

## 2017-02-22 NOTE — Op Note (Deleted)
  The note originally documented on this encounter has been moved the the encounter in which it belongs.  

## 2017-02-22 NOTE — Op Note (Signed)
NAMEJAMARQUIS, CRULL NO.:  [id number]  MEDICAL RECORD NO.:  [id number]  LOCATION:                                 FACILITY:  PHYSICIAN:  Alexis Frock, MD     DATE OF BIRTH:  1961-02-18  DATE OF PROCEDURE: 02/21/2017                               OPERATIVE REPORT   PREOPERATIVE DIAGNOSIS:  Bilateral renal stones, residual.  PROCEDURE: 1. Bilateral second-stage ureteroscopy with laser lithotripsy. 2. Bilateral retrograde pyelograms interpretation. 3. Insertion of bilateral ureteral stents, 6 x 26 Contour, no tether.  ESTIMATED BLOOD LOSS:  Nil.  COMPLICATIONS:  None.  SPECIMEN:  Bilateral renal ureteral stone fragments given to the patient.  FINDINGS: 1. Small volume right intra renal stone papillary tip.  Total volume     estimated to be 8 mm. 2. Residual multifocal left intrarenal stone.  Estimated residual     volume approximately 1 cm. 3. Complete resolution of all stone fragments larger than 1/3rd mm     following laser lithotripsy and basket extraction. 4. Successful replacement of bilateral ureteral stents, proximal in     renal pelvis and distal in urinary bladder.  INDICATION:  Mr. Peed is a very pleasant 56 year old gentleman with history of recurrent nephrolithiasis.  He has had ongoing hematuria and recurrent flank pain.  He was found on workup of this to have a very large volume left-sided multifocal renal stones with smaller volume right-sided renal stones.  Options were discussed including surveillance versus percutaneous surgery on the left, ureteroscopic on the right versus staged ureteroscopy, and he wished proceed with the latter with goal of stone free.  He underwent first-stage procedure on January 31, 2017.  At which point, the majority of the left-sided renal stone volume was addressed.  He now presents for bilateral second-stage procedure with the goal of bilateral stone free.  No interval fevers.  Informed consent was  obtained and placed in the medical record.  PROCEDURE IN DETAIL:  The patient being Lamarkus Nebel verified. Procedure being bilateral second-stage ureteroscopy was confirmed. Procedure was carried out.  Time-out was performed.  Intravenous antibiotics were administered.  General anesthesia introduced.  The patient placed into a low lithotomy position.  Sterile field was created by prepping and draping the patient's penis, perineum, proximal thighs using iodine.  Cystourethroscopy was performed using a rigid cystoscope with the offset lens.  Inspection of the anterior and posterior urethra were unremarkable.  Inspection of bladder revealed distal end of bilateral ureteral stents in situ.  The distal end of the right stent was grasped, brought to the level of urethral meatus which a 0.038 Zip wire was advanced to the level of the lower pole set aside as a safety wire.  Similarly, the left stent was removed and a separate Zip wire placed on the left.  An 8-French feeding tube was placed in urinary bladder for pressure release.  Next, semi-rigid ureteroscopy was performed in the distal four-fifths of the right ureter alongside a separate Sensor working wire.  After retrograde pyelogram revealed no intraluminal obstruction in a single system right ureter and right kidney.  The semi-rigid scope was exchanged for a 12/14, 38-cm ureteral access sheath  at the level of proximal ureter and systematic ureteroscopy was performed at the proximal right ureter and systematic inspection of the right kidney using a dual-channel digital ureteroscope which revealed multifocal papillary tip calcifications, total volume approximately 8 mm.  These were sequentially grasped with an Escape basket and brought out in to be given to the patient.  Final ureteroscopic exam revealed complete resolution of all stone fragments larger than 1/3rd mm on the right side.  The access sheath was removed under continuous  vision.  No mucosal abnormalities were found.  Next, semi-rigid ureteroscopy was performed in the distal orifice of the left ureter alongside a separate Sensor working wire.  There were multifocal small volume intrarenal stones consistent with antegrade passage of fragments.  These were sequentially grasped, brought to the level of the bladder for later retrieval.  After clearing the distal orifice of the ureter the semi-rigid scope was exchanged for a 12/14, 38 cm ureteral access sheath using continuous fluoroscopic guidance and flexible digital ureteroscopy performed on the left side including all calices of the left kidney.  There were multifocal relatively small volume stones individually.  The total volume altogether approximately 1 cm.  Some of these were amenable to Escape basketing and they were grasped.  However, these were approximately 2 mm, removed and set aside to be given to the patient.  There were 2 dominant stones that were too large for basketing, holmium laser energy was applied to the stone using settings of 0.2 joules and 20 Hz fragmenting the stone.  Smaller pieces which were then grasped and removed.  Next, an Encompass basket was used with a single channel ureteroscope for better angulation in the lower pole calices and a matting technique was used and all accessible stone fragments larger than 1/3rd mm were removed from all calices.  Following these maneuvers, excellent hemostasis.  No evidence of perforation.  The access sheath was removed under continuous vision.  No mucosal abnormalities were found.  Finally, a new 6 x 26 contour was placed over the remaining safety wire on the left side using cystoscopic and fluoroscopic guidance.  Good proximal and distal deployment were noted. Similarly, a 6 x 26 Contour stent was placed on the right side using cystoscopic and fluoroscopic guidance.  Bladder was emptied per cystoscope.  Procedure was terminated.  The patient  tolerated the procedure well.  There were no immediate periprocedural complications. The patient remains in the operative suite under General Surgery care in stable condition.    ______________________________ Alexis Frock, MD   ______________________________ Alexis Frock, MD    TM/MEDQ  D:  02/21/2017  T:  02/22/2017  Job:  [id number]

## 2017-02-27 DIAGNOSIS — N3001 Acute cystitis with hematuria: Secondary | ICD-10-CM | POA: Diagnosis not present

## 2017-02-27 DIAGNOSIS — R300 Dysuria: Secondary | ICD-10-CM | POA: Diagnosis not present

## 2017-02-28 DIAGNOSIS — N200 Calculus of kidney: Secondary | ICD-10-CM | POA: Diagnosis not present

## 2017-02-28 DIAGNOSIS — R32 Unspecified urinary incontinence: Secondary | ICD-10-CM | POA: Diagnosis not present

## 2017-02-28 DIAGNOSIS — I10 Essential (primary) hypertension: Secondary | ICD-10-CM | POA: Diagnosis not present

## 2017-02-28 DIAGNOSIS — N390 Urinary tract infection, site not specified: Secondary | ICD-10-CM | POA: Diagnosis not present

## 2017-02-28 DIAGNOSIS — E559 Vitamin D deficiency, unspecified: Secondary | ICD-10-CM | POA: Diagnosis not present

## 2017-02-28 DIAGNOSIS — R7309 Other abnormal glucose: Secondary | ICD-10-CM | POA: Diagnosis not present

## 2017-02-28 DIAGNOSIS — Z0001 Encounter for general adult medical examination with abnormal findings: Secondary | ICD-10-CM | POA: Diagnosis not present

## 2017-02-28 DIAGNOSIS — L304 Erythema intertrigo: Secondary | ICD-10-CM | POA: Diagnosis not present

## 2017-02-28 DIAGNOSIS — K625 Hemorrhage of anus and rectum: Secondary | ICD-10-CM | POA: Diagnosis not present

## 2017-03-07 DIAGNOSIS — Z1211 Encounter for screening for malignant neoplasm of colon: Secondary | ICD-10-CM | POA: Diagnosis not present

## 2017-03-07 DIAGNOSIS — R1013 Epigastric pain: Secondary | ICD-10-CM | POA: Diagnosis not present

## 2017-03-07 DIAGNOSIS — K625 Hemorrhage of anus and rectum: Secondary | ICD-10-CM | POA: Diagnosis not present

## 2017-03-08 DIAGNOSIS — G4733 Obstructive sleep apnea (adult) (pediatric): Secondary | ICD-10-CM | POA: Diagnosis not present

## 2017-03-08 DIAGNOSIS — N200 Calculus of kidney: Secondary | ICD-10-CM | POA: Diagnosis not present

## 2017-03-14 DIAGNOSIS — L304 Erythema intertrigo: Secondary | ICD-10-CM | POA: Diagnosis not present

## 2017-03-15 DIAGNOSIS — I4510 Unspecified right bundle-branch block: Secondary | ICD-10-CM | POA: Diagnosis not present

## 2017-03-15 DIAGNOSIS — I499 Cardiac arrhythmia, unspecified: Secondary | ICD-10-CM | POA: Diagnosis not present

## 2017-03-15 DIAGNOSIS — R000 Tachycardia, unspecified: Secondary | ICD-10-CM | POA: Diagnosis not present

## 2017-03-16 ENCOUNTER — Telehealth: Payer: Self-pay | Admitting: Cardiovascular Disease

## 2017-03-16 NOTE — Telephone Encounter (Signed)
Received records from [facility] for appointment on 03/20/17 with Dr Claiborne Billings.  Records put with Dr Evette Georges schedule for 03/20/17. lp

## 2017-03-20 ENCOUNTER — Ambulatory Visit (INDEPENDENT_AMBULATORY_CARE_PROVIDER_SITE_OTHER): Payer: BLUE CROSS/BLUE SHIELD | Admitting: Cardiovascular Disease

## 2017-03-20 ENCOUNTER — Encounter: Payer: Self-pay | Admitting: Cardiovascular Disease

## 2017-03-20 VITALS — BP 132/90 | HR 70 | Ht 70.0 in | Wt 357.0 lb

## 2017-03-20 DIAGNOSIS — N200 Calculus of kidney: Secondary | ICD-10-CM | POA: Diagnosis not present

## 2017-03-20 DIAGNOSIS — G4733 Obstructive sleep apnea (adult) (pediatric): Secondary | ICD-10-CM

## 2017-03-20 DIAGNOSIS — I480 Paroxysmal atrial fibrillation: Secondary | ICD-10-CM

## 2017-03-20 DIAGNOSIS — I10 Essential (primary) hypertension: Secondary | ICD-10-CM | POA: Diagnosis not present

## 2017-03-20 MED ORDER — METOPROLOL SUCCINATE ER 50 MG PO TB24: 50.0000 mg | ORAL_TABLET | Freq: Every day | ORAL | 6 refills | Status: DC

## 2017-03-20 NOTE — Progress Notes (Signed)
Patient ID: Leslie Soto, male   DOB: [birth year], 56 y.o.   MRN: [id number]     PATIENT PROFILE: Leslie Soto is a 56 y.o. male who is a former patient of Dr. Rollene Fare.  He established care with me in January 2016.  He presents for follow-up evaluation.  HPI:  [name] has a history of morbid obesity, hypertension, obstructive sleep apnea on CPAP therapy, and has had recurrent episodes of nephrolithiasis.  He is a Administrator for [employer].  In the past, he also has been documented to have peptic ulcer disease with both prepyloric and pyloric channel ulcers.  He is followed by Dr. Noah Delaine for primary care.  He has a history of hypertension for at least 30 years and recently has been taking amlodipine 10 mg, Toprol-XL 50 mg and Benicar HCT 40/12.5 mg.  He has been on CPAP therapy for at least 3 years and uses a full face mask.  He continues to work long hours and typically works 60 hour weeks.  He has continued to have difficulty with recurrent kidney stones.  Review of the chart indicates that he had a nuclear perfusion study in June 2013 which revealed normal perfusion.  An echo Doppler study was done in April 2014 which showed an ejection fraction of 55-60%.  He had normal wall motion and normal diastolic parameters.  There was evidence for mild pulmonary hypertension with a PA pressure 37 mm.  Remotely, he had lost approximately 120 pounds when his wife died in 02-08-1998.  Subsequent has gained this weight back over the last several years has gained approximately 20-30 pounds.  He denies chest pain.  He denies palpitations.  He denies PND or orthopnea.  Laboratory by Dr. Noah Delaine from 12/21/2014: Hemoglobin was 16.7, hematocrit 48.8.  White blood count mildly elevated at 12.0.  Fasting glucose was elevated at 117.  Had normal kidney function and normal LFTs.  Lipid studies revealed a cholesterol of 185, triglycerides 165 LDL cholesterol 120, and he had a very low HDL cholesterol  at 32.  He had significant vitamin D deficiency at 11.5.    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  His weight had risen up to 390 pounds last summer, but since that time he admits to a 30 pound weight loss. He was found to have kidney stones and underwent evaluation and treatment by Dr. Tammi Klippel.  He recently was about to undergo a colonoscopy and was found to be in atrial fibrillation.  He was referred to Dr. Katrine Coho office is now referred for cardiology evaluation.  Of note, I reviewed the office note from 03/15/2017 @[facility] and at that time his ECG showed sinus rhythm.  He denies any chest pain.  He is unaware of any present  palpitations. He presents for evaluation.   Past Medical History:  Diagnosis Date  . Degenerative disc disease    ?  . GI bleed due to NSAIDs   . History of kidney stones   . Hypertension   . Lower back pain   . Obesity (BMI 35.0-39.9 without comorbidity)   . OSA (obstructive sleep apnea)    Dr. Vertell Limber, [place]-uses BIPAP at night  . Spina bifida ([place])    ?    Past Surgical History:  Procedure Laterality Date  . CARDIOVASCULAR STRESS TEST  06/12/2012   Normal pattern of perfusion in  all regions, no ECG changes, EKG negative for ischemia.  . CYSTOSCOPY/URETEROSCOPY/HOLMIUM LASER/STENT PLACEMENT Bilateral 01/31/2017   Procedure: CYSTOSCOPY/BILATERAL RETROGRADE PYELOGRAM/ LEFT URETEROSCOPYLEFT /HOLMIUM LASER/BILATERAL STENT PLACEMENT;  Surgeon: Alexis Frock, MD;  Location: [facility];  Service: Urology;  Laterality: Bilateral;  . CYSTOSCOPY/URETEROSCOPY/HOLMIUM LASER/STENT PLACEMENT Bilateral 02/21/2017   Procedure: CYSTOSCOPY/URETEROSCOPY/HOLMIUM LASER/STENT REPLACEMENT BILATERAL RETROGRADE;  Surgeon: Alexis Frock, MD;  Location: [facility];  Service: Urology;  Laterality: Bilateral;  . ESOPHAGOGASTRODUODENOSCOPY  08/28/2012   Procedure:  ESOPHAGOGASTRODUODENOSCOPY (EGD);  Surgeon: Inda Castle, MD;  Location: [place];  Service: Endoscopy;  Laterality: N/A;  . EXTRACORPOREAL SHOCK WAVE LITHOTRIPSY     2003  . EYE SURGERY     1969  . HERNIA REPAIR  09/20/1983   87, 86  . HERNIA REPAIR  09/25/1997  . KNEE ARTHROSCOPY  01/20/2011  . TRANSTHORACIC ECHOCARDIOGRAM  04/03/2013   EF 55-60%, moderate concentric hypertrophy,     No Known Allergies  Current Outpatient Prescriptions  Medication Sig Dispense Refill  . acetaminophen (TYLENOL) 500 MG tablet Take 500-750 mg by mouth 2 (two) times daily as needed for moderate pain or headache.    Marland Kitchen amLODipine (NORVASC) 10 MG tablet Take 10 mg by mouth every evening.     . B Complex-Folic Acid (B COMPLEX-VITAMIN B12 PO) Take 1 tablet by mouth daily.    . Cholecalciferol (VITAMIN D3) 1000 units CAPS Take 1,000 Units by mouth daily.    Marland Kitchen GLUCOSAMINE-CHONDROITIN PO Take 2 capsules by mouth 2 (two) times daily.     . Magnesium 250 MG TABS Take 250 mg by mouth daily.    . Omega-3 Fatty Acids (OMEGA-3 FISH OIL) 300 MG CAPS Take 300 mg by mouth daily.    . valsartan-hydrochlorothiazide (DIOVAN-HCT) 320-12.5 MG tablet Take 1 tablet by mouth daily.   0  . metoprolol succinate (TOPROL XL) 50 MG 24 hr tablet Take 1 tablet (50 mg total) by mouth daily. Take with or immediately following a meal. 30 tablet 6   No current facility-administered medications for this visit.     Social history is notable in that he is widowed.  He has no children.  He lives by himself.  He works long hours.  He is not routinely exercise.  There is no tobacco use.  He does not drink alcohol.  Family History  Problem Relation Age of Onset  . Mental illness Father   . Heart disease Father   . Diabetes Father   . Hypertension Father   . Stroke Mother   . Hypertension Mother   . Hypertension Brother    Is father underwent CABG surgery and is deceased.  His mother is still living at age 4.  He has 3 brothers  with mental illness and heart problems.  ROS General: Negative; No fevers, chills, or night sweats; positive for morbid obesity HEENT: Negative; No changes in vision or hearing, sinus congestion, difficulty swallowing Pulmonary: Negative; No cough, wheezing, shortness of breath, hemoptysis Cardiovascular:  See HPI; No chest pain, presyncope, syncope, palpitations, edema GI: Negative; No nausea, vomiting, diarrhea, or abdominal pain GU: Negative; No dysuria, hematuria, or difficulty voiding Musculoskeletal:  Positive for left knee arthroscopic by Dr. Sandie Ano Hematologic/Oncologic: Negative; no easy bruising, bleeding Endocrine: Negative; no heat/cold intolerance; no diabetes Neuro: Negative; no changes in balance, headaches Skin: Negative; No rashes or skin lesions Psychiatric: Negative; No behavioral problems, depression Sleep: Positive for obstructive sleep apnea; No daytime sleepiness, hypersomnolence, bruxism, restless legs, hypnogagnic hallucinations Other comprehensive 14 point system review  is negative   Physical Exam BP 132/90   Pulse 70   Ht 5' 10"  (1.778 m)   Wt (!) 357 lb (161.9 kg)   BMI 51.22 kg/m    Repeat blood pressure by me 138/88  Wt Readings from Last 3 Encounters:  03/20/17 (!) 357 lb (161.9 kg)  02/21/17 (!) 355 lb 8 oz (161.3 kg)  01/31/17 (!) 355 lb (161 kg)   General: Alert, oriented, no distress.  Skin: normal turgor, no rashes, warm and dry HEENT: Normocephalic, atraumatic. Pupils equal round and reactive to light; sclera anicteric; extraocular muscles intact; Fundi arteriolar narrowing.  No hemorrhages or exudates.   Nose without nasal septal hypertrophy Mouth/Parynx benign; Mallinpatti scale 3/4 Neck: No JVD, no carotid bruits; normal carotid upstroke Lungs: clear to ausculatation and percussion; no wheezing or rales Chest wall: without tenderness to palpitation Heart: PMI not displaced, RRR, s1 s2 normal, 1/6 systolic murmur, no diastolic murmur,  no rubs, gallops, thrills, or heaves Abdomen: Morbidly obese with significant central adiposity;soft, nontender; no hepatosplenomehaly, BS+; abdominal aorta nontender and not dilated by palpation. Back: no CVA tenderness Pulses 2+ Musculoskeletal: full range of motion, normal strength, no joint deformities Extremities: Mild lower extremity edema; no clubbing cyanosis, Homan's sign negative  Neurologic: grossly nonfocal; Cranial nerves grossly wnl Psychologic: Normal mood and affect  ECG (independently read by me): Normal sinus rhythm at 71 bpm.  Right bundle branch block with repolarization changes.  January 2016 ECG (independently read by me): Normal sinus rhythm at 78 bpm.  No ectopy.  No significant ST segment changes.  Poor R wave progression.  LABS:  BMP Latest Ref Rng & Units 02/21/2017 01/30/2017 08/26/2012  Glucose 65 - 99 mg/dL 101(H) 111(H) 111(H)  BUN 6 - 20 mg/dL 17 15 36(H)  Creatinine 0.61 - 1.24 mg/dL 0.97 0.74 0.77  Sodium 135 - 145 mmol/L 138 139 141  Potassium 3.5 - 5.1 mmol/L 4.1 3.9 4.1  Chloride 101 - 111 mmol/L 108 108 109  CO2 22 - 32 mmol/L 22 21(L) 22  Calcium 8.9 - 10.3 mg/dL 8.9 9.6 9.8   Hepatic Function Latest Ref Rng & Units 08/26/2012  Total Protein 6.0 - 8.3 g/dL 6.6  Albumin 3.5 - 5.2 g/dL 3.6  AST 0 - 37 U/L 13  ALT 0 - 53 U/L 19  Alk Phosphatase 39 - 117 U/L 74  Total Bilirubin 0.3 - 1.2 mg/dL 0.7   CBC Latest Ref Rng & Units 02/21/2017 01/30/2017 08/28/2012  WBC 4.0 - 10.5 K/uL 12.4(H) 11.4(H) 12.2(H)  Hemoglobin 13.0 - 17.0 g/dL 15.0 16.9 10.5(L)  Hematocrit 39.0 - 52.0 % 43.3 47.8 30.7(L)  Platelets 150 - 400 K/uL 221 207 192   Lab Results  Component Value Date   MCV 89.8 02/21/2017   MCV 89.5 01/30/2017   MCV 91.9 08/28/2012   No results found for: TSH   Lipid Panel  No results found for: CHOL, TRIG, HDL, CHOLHDL, VLDL, LDLCALC, LDLDIRECT   RADIOLOGY: No results found.  IMPRESSION:  1. Essential hypertension   2. OSA (obstructive  sleep apnea)   3. Paroxysmal atrial fibrillation ([facility])   4. Morbid obesity ([place])     ASSESSMENT AND PLAN: Mr. Dhaliwal is a 56 year old gentleman with super morbid obesity and a body mass index of 51.22.  He has a 30 year history of hypertension.  He has a history of obstructive sleep apnea and is on CPAP therapy utilizing a full face mask.  He admits to 100% compliance.  He  admits to being under increased stress at work, particularly with his shift changes such that at times he must be a worker 1 AM other times at 5 AM and typically works 12-14 hour days.  This has resulted in his difficult sleep pattern.  He was recently found to have a short episode of atrial fibrillation while undergoing  prep for his colonoscpy.  When he was [facility] at [facility], his ECG was sinus rhythm.  His ECG today is sinus rhythm and he is unaware of any recurrent arrhythmia.  I am recommending he undergo an echo Doppler study.  Assess systolic and diastolic function as well as pulmonary hypertension, particularly with his obstructive sleep apnea. Colonoscopy was to be performed due to recent GI blood loss.  His blood pressure today is mildly elevated and I discussed with him the new hypertensive guidelines.   I am electing to add Toprol-XL 50 mg to his medical regimen, both for blood pressure control and also reduce potential future arrhythmia.  He continues to be on amlodipine 10 mg as well as valsartan HCT 320/12.5 mg for his hypertension.  He underwent 2 procedures by Dr. Tammi Klippel for his nephrolithiasis and denies recurrent discomfort.  He is super morbidly obese and we discussed importance of weight loss and exercising at least 5 days per week if at all possible.  He can return to work on 03/26/17, but I have recommended that his maximum hours be no more than 12 and that he not start work before 6 AM.  I will see him in 6 weeks for reevaluation.  Time spent: 30 minutes  Troy Sine, MD, [facility] 03/22/2017 5:27 PM

## 2017-03-20 NOTE — Patient Instructions (Signed)
Your physician has recommended you make the following change in your medication:   1.)  A prescription for metoprolol succ 50 mg has been sent to your pharmacy.  Your physician has requested that you have an echocardiogram. Echocardiography is a painless test that uses sound waves to create images of your heart. It provides your doctor with information about the size and shape of your heart and how well your heart's chambers and valves are working. This procedure takes approximately one hour. There are no restrictions for this procedure. This will be done at the [facility].   Your physician recommends that you schedule a follow-up appointment in: 6 WEEKS.

## 2017-03-21 DIAGNOSIS — L304 Erythema intertrigo: Secondary | ICD-10-CM | POA: Diagnosis not present

## 2017-04-04 ENCOUNTER — Ambulatory Visit (HOSPITAL_COMMUNITY): Payer: BLUE CROSS/BLUE SHIELD | Attending: Cardiovascular Disease

## 2017-04-04 ENCOUNTER — Other Ambulatory Visit: Payer: Self-pay

## 2017-04-04 DIAGNOSIS — I10 Essential (primary) hypertension: Secondary | ICD-10-CM | POA: Diagnosis not present

## 2017-05-03 ENCOUNTER — Encounter: Payer: Self-pay | Admitting: Cardiovascular Disease

## 2017-05-03 ENCOUNTER — Ambulatory Visit (INDEPENDENT_AMBULATORY_CARE_PROVIDER_SITE_OTHER): Payer: BLUE CROSS/BLUE SHIELD | Admitting: Cardiovascular Disease

## 2017-05-03 VITALS — BP 116/72 | HR 63 | Ht 70.0 in | Wt 359.0 lb

## 2017-05-03 DIAGNOSIS — I480 Paroxysmal atrial fibrillation: Secondary | ICD-10-CM | POA: Diagnosis not present

## 2017-05-03 DIAGNOSIS — G4733 Obstructive sleep apnea (adult) (pediatric): Secondary | ICD-10-CM

## 2017-05-03 DIAGNOSIS — I10 Essential (primary) hypertension: Secondary | ICD-10-CM

## 2017-05-03 DIAGNOSIS — I498 Other specified cardiac arrhythmias: Secondary | ICD-10-CM | POA: Diagnosis not present

## 2017-05-03 MED ORDER — METOPROLOL SUCCINATE ER 50 MG PO TB24: ORAL_TABLET | ORAL | 6 refills | Status: DC

## 2017-05-03 NOTE — Patient Instructions (Signed)
Your physician has recommended you make the following change in your medication:   1.) the amlodipine has been decreased  From 10 mg to 5 mg daily. (1/2 tablet)  2.) the metoprolol succ has been changed to 1 tablet in the morning and 1/2 tablet at night. A new prescription has been sent to your pharmacy to reflect this change.  Your physician recommends that you schedule a follow-up appointment in: 3 months with Dr Claiborne Billings.

## 2017-05-03 NOTE — Progress Notes (Signed)
Patient ID: Leslie Soto, male   DOB: [birth year], 56 y.o.   MRN: [id number]     PATIENT PROFILE: Leslie Soto is a 55 y.o. male who is a former patient of Leslie Soto.  He established care with me in January 2016.  He presents for a 6 week follow-up evaluation.  HPI:  Leslie Soto has a history of morbid obesity, hypertension, obstructive sleep apnea on CPAP therapy, and has had recurrent episodes of nephrolithiasis.  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  In the past, he also has been documented to have peptic ulcer disease with both prepyloric and pyloric channel ulcers.  He is followed by Leslie Soto for primary care.  He has a history of hypertension for at least 30 years and recently has been taking amlodipine 10 mg, Toprol-XL 50 mg and Benicar HCT 40/12.5 mg.  He has been on CPAP therapy for at least 3 years and uses a full face mask.  He continues to work long hours and typically works 60 hour weeks.  He has continued to have difficulty with recurrent kidney stones.  Review of the chart indicates that he had a nuclear perfusion study in June 2013 which revealed normal perfusion.  An echo Doppler study was done in April 2014 which showed an ejection fraction of 55-60%.  He had normal wall motion and normal diastolic parameters.  There was evidence for mild pulmonary hypertension with a PA pressure 37 mm.  Remotely, he had lost approximately 120 pounds when his wife died in 02/05/98.  Subsequent has gained this weight back over the last several years has gained approximately 20-30 pounds.  He denies chest pain.  He denies palpitations.  He denies PND or orthopnea.  Laboratory by Leslie Soto from 12/21/2014: Hemoglobin was 16.7, hematocrit 48.8.  White blood count mildly elevated at 12.0.  Fasting glucose was elevated at 117.  Had normal kidney function and normal LFTs.  Lipid studies revealed a cholesterol of 185, triglycerides 165 LDL cholesterol 120, and he had a very low HDL  cholesterol at 32.  He had significant vitamin D deficiency at 11.5.    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  His weight had risen up to 390 pounds last summer, but since that time he admits to a 30 pound weight loss. He was found to have kidney stones and underwent evaluation and treatment by Leslie Soto.  He  was about to undergo a colonoscopy and was found to be in atrial fibrillation.  He was referred to Leslie Soto office was referred for cardiology evaluation.  Of note, I reviewed the office note from 03/15/2017 _0  medical associate and at that time his ECG showed sinus rhythm.  He denied any chest pain.   When I saw him for that evaluation, his ECG showed that he was in sinus rhythm at 71 bpm.  We recommended work restrictions such that he should not drive a truck more than 12 hours per day in attempt to reduce some of the work-related stress and long hours with reduced sleep duration.  Since this has been implemented, his stress at work has been significantly reduced.  He underwent echo Doppler study on 04/04/2017 which showed ejection fraction of 55-60% with normal wall motion and grade 1 diastolic dysfunction.  There was no significant valvular abnormality.He is unaware of any palpitations.  He has not  been successful yet with weight loss.  He presents for follow-up evaluation.  Past Medical History:  Diagnosis Date  . Degenerative disc disease    ?  . GI bleed due to NSAIDs   . History of kidney stones   . Hypertension   . Lower back pain   . Obesity (BMI 35.0-39.9 without comorbidity)   . OSA (obstructive sleep apnea)    Leslie Soto, Leslie Soto-uses BIPAP at night  . Spina bifida ([place])    ?    Past Surgical History:  Procedure Laterality Date  . CARDIOVASCULAR STRESS TEST  06/12/2012   Normal pattern of perfusion in all regions, no ECG changes, EKG negative for ischemia.  .  CYSTOSCOPY/URETEROSCOPY/HOLMIUM LASER/STENT PLACEMENT Bilateral 01/31/2017   Procedure: CYSTOSCOPY/BILATERAL RETROGRADE PYELOGRAM/ LEFT URETEROSCOPYLEFT /HOLMIUM LASER/BILATERAL STENT PLACEMENT;  Surgeon: Leslie Frock, MD;  Location: [facility];  Service: Urology;  Laterality: Bilateral;  . CYSTOSCOPY/URETEROSCOPY/HOLMIUM LASER/STENT PLACEMENT Bilateral 02/21/2017   Procedure: CYSTOSCOPY/URETEROSCOPY/HOLMIUM LASER/STENT REPLACEMENT BILATERAL RETROGRADE;  Surgeon: Leslie Frock, MD;  Location: [facility];  Service: Urology;  Laterality: Bilateral;  . ESOPHAGOGASTRODUODENOSCOPY  08/28/2012   Procedure: ESOPHAGOGASTRODUODENOSCOPY (EGD);  Surgeon: Leslie Castle, MD;  Location: [facility];  Service: Endoscopy;  Laterality: N/A;  . EXTRACORPOREAL SHOCK WAVE LITHOTRIPSY     2003  . EYE SURGERY     1969  . HERNIA REPAIR  09/20/1983   85, 84  . HERNIA REPAIR  09/25/1997  . KNEE ARTHROSCOPY  01/20/2011  . TRANSTHORACIC ECHOCARDIOGRAM  04/03/2013   EF 55-60%, moderate concentric hypertrophy,     No Known Allergies  Current Outpatient Prescriptions  Medication Sig Dispense Refill  . acetaminophen (TYLENOL) 500 MG tablet Take 500-750 mg by mouth 2 (two) times daily as needed for moderate pain or headache.    Marland Kitchen amLODipine (NORVASC) 10 MG tablet Take 5 mg by mouth every evening.    . B Complex-Folic Acid (B COMPLEX-VITAMIN B12 PO) Take 1 tablet by mouth daily.    . Cholecalciferol (VITAMIN D3) 1000 units CAPS Take 1,000 Units by mouth daily.    Marland Kitchen GLUCOSAMINE-CHONDROITIN PO Take 2 capsules by mouth 2 (two) times daily.     . Magnesium 500 MG TABS Take 500 mg by mouth daily.     . metoprolol succinate (TOPROL XL) 50 MG 24 hr tablet Take 1 tablet in the morning and 1/2 tablet at night 45 tablet 6  . Omega-3 Fatty Acids (OMEGA-3 FISH OIL) 300 MG CAPS Take 300 mg by mouth daily.    . valsartan-hydrochlorothiazide (DIOVAN-HCT) 320-12.5 MG tablet Take 1 tablet by mouth daily.   0   No current  facility-administered medications for this visit.     Social history is notable in that he is widowed.  He has no children.  He lives by himself.  He works long hours.  He is not routinely exercise.  There is no tobacco use.  He does not drink alcohol.  Family History  Problem Relation Age of Onset  . Mental illness Father   . Heart disease Father   . Diabetes Father   . Hypertension Father   . Stroke Mother   . Hypertension Mother   . Hypertension Brother    Is father underwent CABG surgery and is deceased.  His mother is still living at age 4.  He has 3 brothers with mental illness and heart problems.  ROS General: Negative; No fevers, chills, or night sweats; positive for morbid obesity HEENT: Negative; No changes in vision or hearing, sinus  congestion, difficulty swallowing Pulmonary: Negative; No cough, wheezing, shortness of breath, hemoptysis Cardiovascular:  See HPI; No chest pain, presyncope, syncope, palpitations, edema GI: Negative; No nausea, vomiting, diarrhea, or abdominal pain GU: Negative; No dysuria, hematuria, or difficulty voiding Musculoskeletal:  Positive for left knee arthroscopic by Dr. Sandie Ano Hematologic/Oncologic: Negative; no easy bruising, bleeding Endocrine: Negative; no heat/cold intolerance; no diabetes Neuro: Negative; no changes in balance, headaches Skin: Negative; No rashes or skin lesions Psychiatric: Negative; No behavioral problems, depression Sleep: Positive for obstructive sleep apnea; No daytime sleepiness, hypersomnolence, bruxism, restless legs, hypnogagnic hallucinations Other comprehensive 14 point system review is negative   Physical Exam BP 116/72   Pulse 63   Ht _0  (1.778 m)   Wt (!) 359 lb (162.8 kg)   BMI 51.51 kg/m    Repeat blood pressure by me 138/88  Wt Readings from Last 3 Encounters:  05/03/17 (!) 359 lb (162.8 kg)  03/20/17 (!) 357 lb (161.9 kg)  02/21/17 (!) 355 lb 8 oz (161.3 kg)   General: Alert,  oriented, no distress.  Skin: normal turgor, no rashes, warm and dry HEENT: Normocephalic, atraumatic. Pupils equal round and reactive to light; sclera anicteric; extraocular muscles intact; Fundi arteriolar narrowing.  No hemorrhages or exudates.   Nose without nasal septal hypertrophy Mouth/Parynx benign; Mallinpatti scale 3/4 Neck: No JVD, no carotid bruits; normal carotid upstroke Lungs: clear to ausculatation and percussion; no wheezing or rales Chest wall: without tenderness to palpitation Heart: PMI not displaced; ventricular rate in the 60s, but with an atrial bigeminal pattern;  s1 s2 normal, 1/6 systolic murmur, no diastolic murmur, no rubs, gallops, thrills, or heaves Abdomen: Morbidly obese with significant central adiposity;soft, nontender; no hepatosplenomehaly, BS+; abdominal aorta nontender and not dilated by palpation. Back: no CVA tenderness Pulses 2+ Musculoskeletal: full range of motion, normal strength, no joint deformities Extremities: trace bilateral lower extremity edema; no clubbing cyanosis, Homan's sign negative  Neurologic: grossly nonfocal; Cranial nerves grossly wnl Psychologic: Normal mood and affect  ECG (independently read by me): sinus rhythm with atrial bigeminy with a ventricular rate at 63 bpm.  PR interval 158 ms.  Right bundle-branch block with repolarization changes.  QTc interval 458 ms.  03/20/2017 ECG (independently read by me): Normal sinus rhythm at 71 bpm.  Right bundle branch block with repolarization changes.  January 2016 ECG (independently read by me): Normal sinus rhythm at 78 bpm.  No ectopy.  No significant ST segment changes.  Poor R wave progression.  LABS:  BMP Latest Ref Rng & Units 02/21/2017 01/30/2017 08/26/2012  Glucose 65 - 99 mg/dL 101(H) 111(H) 111(H)  BUN 6 - 20 mg/dL 17 15 36(H)  Creatinine 0.61 - 1.24 mg/dL 0.97 0.74 0.77  Sodium 135 - 145 mmol/L 138 139 141  Potassium 3.5 - 5.1 mmol/L 4.1 3.9 4.1  Chloride 101 - 111 mmol/L  108 108 109  CO2 22 - 32 mmol/L 22 21(L) 22  Calcium 8.9 - 10.3 mg/dL 8.9 9.6 9.8   Hepatic Function Latest Ref Rng & Units 08/26/2012  Total Protein 6.0 - 8.3 g/dL 6.6  Albumin 3.5 - 5.2 g/dL 3.6  AST 0 - 37 U/L 13  ALT 0 - 53 U/L 19  Alk Phosphatase 39 - 117 U/L 74  Total Bilirubin 0.3 - 1.2 mg/dL 0.7   CBC Latest Ref Rng & Units 02/21/2017 01/30/2017 08/28/2012  WBC 4.0 - 10.5 K/uL 12.4(H) 11.4(H) 12.2(H)  Hemoglobin 13.0 - 17.0 g/dL 15.0 16.9 10.5(L)  Hematocrit 39.0 -  52.0 % 43.3 47.8 30.7(L)  Platelets 150 - 400 K/uL 221 207 192   Lab Results  Component Value Date   MCV 89.8 02/21/2017   MCV 89.5 01/30/2017   MCV 91.9 08/28/2012   No results found for: TSH   Lipid Panel  No results found for: CHOL, TRIG, HDL, CHOLHDL, VLDL, LDLCALC, LDLDIRECT   RADIOLOGY: No results found.  IMPRESSION:  1. Essential hypertension   2. Morbid obesity (H[facility])   3. Paroxysmal atrial fibrillation ([place])   4. Atrial bigeminy   5. OSA (obstructive sleep apnea)     ASSESSMENT AND PLAN: Mr. Curley is a 56 year old gentleman with super morbid obesity and a body mass index of 51.  He has a 30 year history of hypertension.  He has a history of obstructive sleep apnea and continues to be on CPAP therapy utilizing a full face mask.  He admits to 100% compliance.  When I saw him last, he was under increased stress at work, particularly with his shift changes such that at times he must be a worker 1 AM other times at 5 AM and typically works 12-14 hour days.  This has resulted in his difficult sleep pattern.  He had been found to have a episode of atrial fibrillation.  Unfortunately since that evaluation is unaware of any recurrence.  His stress at work is now been reduced with his work restriction to no more than 12 hours per day.  Commencing at 6 AM's in attempt to improve his sleep pattern and reduce stress load.  His ECG today confirms sinus rhythm but he has an atrial bigeminal pattern.  His blood  pressure is stable.  I am recommending reduction of amlodipine from 10 mg to 5 mg and will further titrate Toprol XL to 75 mg daily and he will take 50 mg in the morning and 25 mg at night.  BMI today is 51.51 and continues to be consistent with super morbid obesity.  We had a lengthy discussion concerning exercise as well as weight reduction.  He has changed his diet and is no longer eating fried foods. I reviewed his echo Doppler dated with him in detail.  He will be seeing Dr. Shelia Media in several weeks and repeat laboratory will be obtained.  I will see him in 3 months for cardiology reevaluation. Time spent: 25 minutes  Troy Sine, MD, [facility] 05/03/2017 9:02 AM

## 2017-05-04 DIAGNOSIS — N200 Calculus of kidney: Secondary | ICD-10-CM | POA: Diagnosis not present

## 2017-05-30 DIAGNOSIS — I10 Essential (primary) hypertension: Secondary | ICD-10-CM | POA: Diagnosis not present

## 2017-05-30 DIAGNOSIS — E785 Hyperlipidemia, unspecified: Secondary | ICD-10-CM | POA: Diagnosis not present

## 2017-05-31 ENCOUNTER — Telehealth: Payer: Self-pay | Admitting: Cardiovascular Disease

## 2017-05-31 NOTE — Telephone Encounter (Signed)
Returned the call to the patient. He stated that since his increase in Metoprolol from 50 mg daily to 75 mg daily he has felt sluggish. The feeling normally hits 3-4 hours after taking his Metoprolol. It is a concern of his because of his line of work. He has had to take off of work and now needs a clearance note to return. He stated that he does not take his blood pressure on a regular basis and then he only takes it at night. When he has taken it, it has been at night. He stated that his systolic has been in the 159/470'R. He has been instructed to take his blood pressure in the afternoon, a few hours after taking the Metoprolol. Will route to the provider for recommendation.

## 2017-05-31 NOTE — Telephone Encounter (Signed)
New message     Since the change in dosage of medication, it is making him very weak , he had to take off work because he cant climb the latter it is unsafe , he has to have medical clearance now saying he is ok to do his job.   Pt c/o medication issue:  1. Name of Medication:  metoprolol succinate (TOPROL XL) 50 MG 24 hr tablet Take 1 tablet in the morning and 1/2 tablet at night    amLODipine (NORVASC) 10 MG tablet Take 5 mg by mouth every evening.    2. How are you currently taking this medication (dosage and times per day)?  As prescribed  3. Are you having a reaction (difficulty breathing--STAT)? no  4. What is your medication issue? Very weak

## 2017-05-31 NOTE — Telephone Encounter (Signed)
The patient called back to inform that he is taking Metoprolol 25 mg in the morning and 25 mg in the evening and has gone back to the Amlodipine 10 mg in the evening. These are the dosages he was on before it increased previously. He would like to know if this is okay with the provider and stated he still needs a note to return to work. Will route the update to the provider.

## 2017-05-31 NOTE — Telephone Encounter (Signed)
F/u Message  Pt call requesting to speak with RN to f/u on situation he called perviously about. Please call back to discuss

## 2017-06-04 DIAGNOSIS — R195 Other fecal abnormalities: Secondary | ICD-10-CM | POA: Diagnosis not present

## 2017-06-04 DIAGNOSIS — Z6841 Body Mass Index (BMI) 40.0 and over, adult: Secondary | ICD-10-CM | POA: Diagnosis not present

## 2017-06-04 DIAGNOSIS — I10 Essential (primary) hypertension: Secondary | ICD-10-CM | POA: Diagnosis not present

## 2017-06-07 NOTE — Telephone Encounter (Signed)
Left message for pt to call.

## 2017-06-07 NOTE — Telephone Encounter (Signed)
As long as blood pressures remaining stable, he can reduce the dose.  Okay to return to work.  Please provide a note to return to work.

## 2017-06-11 NOTE — Telephone Encounter (Signed)
Left message for pt to call.

## 2017-06-12 NOTE — Telephone Encounter (Signed)
Spoke with pt, he does not need anything at this time. Patient reports he had another doctor take care of his issue.

## 2017-06-18 DIAGNOSIS — E669 Obesity, unspecified: Secondary | ICD-10-CM | POA: Diagnosis not present

## 2017-06-18 DIAGNOSIS — K625 Hemorrhage of anus and rectum: Secondary | ICD-10-CM | POA: Diagnosis not present

## 2017-06-18 DIAGNOSIS — I480 Paroxysmal atrial fibrillation: Secondary | ICD-10-CM | POA: Diagnosis not present

## 2017-06-29 NOTE — Anesthesia Postprocedure Evaluation (Signed)
Anesthesia Post Note  Patient: Leslie Soto  Procedure(s) Performed: Procedure(s) (LRB): CYSTOSCOPY/URETEROSCOPY/HOLMIUM LASER/STENT REPLACEMENT BILATERAL RETROGRADE (Bilateral)     Anesthesia Post Evaluation  Last Vitals:  Vitals:   02/21/17 1430 02/21/17 1459  BP: 122/72 116/75  Pulse: 81 94  Resp: (!) 21 15  Temp: 37.1 C 36.9 C    Last Pain:  Vitals:   02/21/17 1430  TempSrc:   PainSc: Asleep                 Nolon Nations

## 2017-06-29 NOTE — Addendum Note (Signed)
Addendum  created 06/29/17 0931 by Nolon Nations, MD   Sign clinical note

## 2017-07-06 DIAGNOSIS — Z0189 Encounter for other specified special examinations: Secondary | ICD-10-CM | POA: Diagnosis not present

## 2017-07-06 DIAGNOSIS — I10 Essential (primary) hypertension: Secondary | ICD-10-CM | POA: Diagnosis not present

## 2017-07-06 DIAGNOSIS — I4510 Unspecified right bundle-branch block: Secondary | ICD-10-CM | POA: Diagnosis not present

## 2017-07-10 DIAGNOSIS — Z0001 Encounter for general adult medical examination with abnormal findings: Secondary | ICD-10-CM | POA: Diagnosis not present

## 2017-07-30 ENCOUNTER — Ambulatory Visit: Payer: BLUE CROSS/BLUE SHIELD | Admitting: Cardiovascular Disease

## 2017-09-10 ENCOUNTER — Telehealth: Payer: Self-pay | Admitting: *Deleted

## 2017-09-10 NOTE — Telephone Encounter (Signed)
Requesting surgical clearance:  1. Type of surgery: EGD and colonscopy  2. Surgeon: N/A  3.Surgical Date:TBD  4. Medications that need to be held: NONE   5. CAD: No  Reviewed and signed by Dr. Claiborne Billings, low risk.    Faxed to [facility] at [phone]

## 2017-09-14 DIAGNOSIS — I10 Essential (primary) hypertension: Secondary | ICD-10-CM | POA: Diagnosis not present

## 2017-09-14 DIAGNOSIS — R079 Chest pain, unspecified: Secondary | ICD-10-CM | POA: Diagnosis not present

## 2017-09-14 DIAGNOSIS — R0789 Other chest pain: Secondary | ICD-10-CM | POA: Diagnosis not present

## 2017-09-14 DIAGNOSIS — R0781 Pleurodynia: Secondary | ICD-10-CM | POA: Diagnosis not present

## 2017-09-14 DIAGNOSIS — R0602 Shortness of breath: Secondary | ICD-10-CM | POA: Diagnosis not present

## 2017-12-03 ENCOUNTER — Other Ambulatory Visit: Payer: Self-pay | Admitting: Gastroenterology

## 2017-12-03 DIAGNOSIS — Z1211 Encounter for screening for malignant neoplasm of colon: Secondary | ICD-10-CM | POA: Diagnosis not present

## 2017-12-03 DIAGNOSIS — R1033 Periumbilical pain: Secondary | ICD-10-CM | POA: Diagnosis not present

## 2017-12-03 DIAGNOSIS — K625 Hemorrhage of anus and rectum: Secondary | ICD-10-CM | POA: Diagnosis not present

## 2017-12-04 ENCOUNTER — Encounter (HOSPITAL_COMMUNITY): Payer: Self-pay

## 2017-12-06 ENCOUNTER — Other Ambulatory Visit: Payer: Self-pay

## 2017-12-06 ENCOUNTER — Encounter (HOSPITAL_COMMUNITY): Payer: Self-pay | Admitting: *Deleted

## 2017-12-07 ENCOUNTER — Ambulatory Visit (HOSPITAL_COMMUNITY)
Admission: RE | Admit: 2017-12-07 | Discharge: 2017-12-07 | Disposition: A | Discharge status: Home / Self Care | Payer: BLUE CROSS/BLUE SHIELD | Source: Ambulatory Visit | Attending: Gastroenterology | Admitting: Gastroenterology

## 2017-12-07 ENCOUNTER — Ambulatory Visit (HOSPITAL_COMMUNITY): Payer: BLUE CROSS/BLUE SHIELD | Admitting: Anesthesiology

## 2017-12-07 ENCOUNTER — Encounter (HOSPITAL_COMMUNITY)
Admission: RE | Disposition: A | Discharge status: Home / Self Care | Payer: Self-pay | Source: Ambulatory Visit | Attending: Gastroenterology

## 2017-12-07 ENCOUNTER — Other Ambulatory Visit: Payer: Self-pay

## 2017-12-07 ENCOUNTER — Encounter (HOSPITAL_COMMUNITY): Payer: Self-pay | Admitting: Anesthesiology

## 2017-12-07 DIAGNOSIS — I10 Essential (primary) hypertension: Secondary | ICD-10-CM | POA: Diagnosis not present

## 2017-12-07 DIAGNOSIS — G4733 Obstructive sleep apnea (adult) (pediatric): Secondary | ICD-10-CM | POA: Diagnosis not present

## 2017-12-07 DIAGNOSIS — Q059 Spina bifida, unspecified: Secondary | ICD-10-CM | POA: Insufficient documentation

## 2017-12-07 DIAGNOSIS — J45909 Unspecified asthma, uncomplicated: Secondary | ICD-10-CM | POA: Diagnosis not present

## 2017-12-07 DIAGNOSIS — K317 Polyp of stomach and duodenum: Secondary | ICD-10-CM | POA: Insufficient documentation

## 2017-12-07 DIAGNOSIS — K5730 Diverticulosis of large intestine without perforation or abscess without bleeding: Secondary | ICD-10-CM | POA: Diagnosis not present

## 2017-12-07 DIAGNOSIS — Z87442 Personal history of urinary calculi: Secondary | ICD-10-CM | POA: Diagnosis not present

## 2017-12-07 DIAGNOSIS — D122 Benign neoplasm of ascending colon: Secondary | ICD-10-CM | POA: Insufficient documentation

## 2017-12-07 DIAGNOSIS — K921 Melena: Secondary | ICD-10-CM | POA: Diagnosis not present

## 2017-12-07 DIAGNOSIS — I4891 Unspecified atrial fibrillation: Secondary | ICD-10-CM | POA: Diagnosis not present

## 2017-12-07 DIAGNOSIS — Z1211 Encounter for screening for malignant neoplasm of colon: Secondary | ICD-10-CM | POA: Diagnosis not present

## 2017-12-07 DIAGNOSIS — K254 Chronic or unspecified gastric ulcer with hemorrhage: Secondary | ICD-10-CM | POA: Diagnosis not present

## 2017-12-07 DIAGNOSIS — Z800 Family history of malignant neoplasm of digestive organs: Secondary | ICD-10-CM | POA: Insufficient documentation

## 2017-12-07 DIAGNOSIS — Z6841 Body Mass Index (BMI) 40.0 and over, adult: Secondary | ICD-10-CM | POA: Insufficient documentation

## 2017-12-07 DIAGNOSIS — K2980 Duodenitis without bleeding: Secondary | ICD-10-CM | POA: Diagnosis not present

## 2017-12-07 DIAGNOSIS — D132 Benign neoplasm of duodenum: Secondary | ICD-10-CM | POA: Diagnosis not present

## 2017-12-07 HISTORY — DX: Abnormal electrocardiogram (ECG) (EKG): R94.31

## 2017-12-07 HISTORY — DX: Unspecified asthma, uncomplicated: J45.909

## 2017-12-07 HISTORY — DX: Pneumonia, unspecified organism: J18.9

## 2017-12-07 HISTORY — PX: COLONOSCOPY WITH PROPOFOL: SHX5780

## 2017-12-07 HISTORY — PX: ESOPHAGOGASTRODUODENOSCOPY (EGD) WITH PROPOFOL: SHX5813

## 2017-12-07 SURGERY — ESOPHAGOGASTRODUODENOSCOPY (EGD) WITH PROPOFOL
Anesthesia: Monitor Anesthesia Care

## 2017-12-07 MED ORDER — SODIUM CHLORIDE 0.9 % IV SOLN: INTRAVENOUS | Status: DC

## 2017-12-07 MED ORDER — PROPOFOL 10 MG/ML IV BOLUS
INTRAVENOUS | Status: AC
  Filled 2017-12-07: qty 60

## 2017-12-07 MED ORDER — ONDANSETRON HCL 4 MG/2ML IJ SOLN
INTRAMUSCULAR | Status: DC | PRN
  Administered 2017-12-07: 4 mg via INTRAVENOUS

## 2017-12-07 MED ORDER — PROPOFOL 10 MG/ML IV BOLUS
INTRAVENOUS | Status: AC
  Filled 2017-12-07: qty 20

## 2017-12-07 MED ORDER — LACTATED RINGERS IV SOLN
INTRAVENOUS | Status: DC
  Administered 2017-12-07: 1000 mL via INTRAVENOUS
  Administered 2017-12-07: 10:00:00 via INTRAVENOUS

## 2017-12-07 MED ORDER — PROPOFOL 500 MG/50ML IV EMUL
INTRAVENOUS | Status: DC | PRN
  Administered 2017-12-07: 50 mg via INTRAVENOUS

## 2017-12-07 MED ORDER — PROPOFOL 500 MG/50ML IV EMUL
INTRAVENOUS | Status: DC | PRN
  Administered 2017-12-07: 100 ug/kg/min via INTRAVENOUS

## 2017-12-07 SURGICAL SUPPLY — 24 items

## 2017-12-07 NOTE — Anesthesia Postprocedure Evaluation (Signed)
Anesthesia Post Note  Patient: Leslie Soto  Procedure(s) Performed: ESOPHAGOGASTRODUODENOSCOPY (EGD) WITH PROPOFOL (N/A ) COLONOSCOPY WITH PROPOFOL (N/A )     Patient location during evaluation: Endoscopy Anesthesia Type: MAC Level of consciousness: awake Pain management: pain level controlled Vital Signs Assessment: post-procedure vital signs reviewed and stable Respiratory status: spontaneous breathing Cardiovascular status: stable Postop Assessment: no apparent nausea or vomiting Anesthetic complications: no    Last Vitals:  Vitals:   12/07/17 1100 12/07/17 1110  BP: (!) 146/90 (!) 147/70  Pulse: (!) 54 (!) 51  Resp: 20 15  Temp:    SpO2: 98% 98%    Last Pain:  Vitals:   12/07/17 1042  TempSrc: Oral  PainSc:    Pain Goal:                 [name],JOHN Brenisha Tsui

## 2017-12-07 NOTE — H&P (View-Only) (Signed)
Leslie Soto HPI: For the past two weeks the patient reports having hematochezia and he also has some abdominal pain. The pain is in the mid abdomen. There does not appear to be a correlation with his abdominal pain and the hematochezia. When he was evaluated in the past he did have epigastric pain. The patient's maternal grandmother had colon cancer in her 12's.   Past Medical History:  Diagnosis Date  . Abnormal EKG    hx right bundle branck block on ekg  . Asthma    cold weather induced  . Degenerative disc disease    ?  . GI bleed due to NSAIDs   . History of kidney stones   . Hypertension   . Lower back pain   . Obesity (BMI 35.0-39.9 without comorbidity)   . OSA (obstructive sleep apnea)    Dr. Vertell Limber, [place]-uses cpap at night  set on 10-8  . Pneumonia 8 years ago   walking pneumonia   . Spina bifida ([place])    L 2 or L 3 defect creating left leg pain     Past Surgical History:  Procedure Laterality Date  . CARDIOVASCULAR STRESS TEST  06/12/2012   Normal pattern of perfusion in all regions, no ECG changes, EKG negative for ischemia.  . CYSTOSCOPY/URETEROSCOPY/HOLMIUM LASER/STENT PLACEMENT Bilateral 01/31/2017   Procedure: CYSTOSCOPY/BILATERAL RETROGRADE PYELOGRAM/ LEFT URETEROSCOPYLEFT /HOLMIUM LASER/BILATERAL STENT PLACEMENT;  Surgeon: Alexis Frock, MD;  Location: [facility];  Service: Urology;  Laterality: Bilateral;  . CYSTOSCOPY/URETEROSCOPY/HOLMIUM LASER/STENT PLACEMENT Bilateral 02/21/2017   Procedure: CYSTOSCOPY/URETEROSCOPY/HOLMIUM LASER/STENT REPLACEMENT BILATERAL RETROGRADE;  Surgeon: Alexis Frock, MD;  Location: [facility];  Service: Urology;  Laterality: Bilateral;  . ESOPHAGOGASTRODUODENOSCOPY  08/28/2012   Procedure: ESOPHAGOGASTRODUODENOSCOPY (EGD);  Surgeon: Inda Castle, MD;  Location: [facility];  Service: Endoscopy;  Laterality: N/A;  . EXTRACORPOREAL SHOCK WAVE LITHOTRIPSY     2003  . EYE SURGERY Bilateral    1969 weak eye muscle  . HERNIA REPAIR   09/20/1983   87, 84  . HERNIA REPAIR  09/25/1997  . KNEE ARTHROSCOPY Left 01/20/2011  . TRANSTHORACIC ECHOCARDIOGRAM  04/03/2013   EF 55-60%, moderate concentric hypertrophy,     Family History  Problem Relation Age of Onset  . Mental illness Father   . Heart disease Father   . Diabetes Father   . Hypertension Father   . Stroke Mother   . Hypertension Mother   . Hypertension Brother     Social History:  reports that  has never smoked. he has never used smokeless tobacco. He reports that he does not drink alcohol or use drugs.  Allergies: No Known Allergies  Medications:  Scheduled:  Continuous: . sodium chloride    . lactated ringers      No results found for this or any previous visit (from the past 24 hour(s)).   No results found.  ROS:  As stated above in the HPI otherwise negative.  Blood pressure (!) 157/91, pulse 63, temperature 98.2 F (36.8 C), temperature source Oral, resp. rate 16, height 5\' 10"  (1.778 m), weight (!) 162.8 kg (359 lb), SpO2 95 %.    PE: Gen: NAD, Alert and Oriented HEENT:  [place]/AT, EOMI Neck: Supple, no LAD Lungs: CTA Bilaterally CV: RRR without M/G/R ABM: Soft, NTND, +BS, morbidly obese Ext: No C/C/E  Assessment/Plan: 1) Hematochezia - From his description it appears to be hemorrhoidal in origin. From a screening standpoint he has been trying to schedule his procedure, but work is very busy.  2) CRC - I have discussed the risks of bleeding, infection, perforation, medication reactions, a 10% miss rate for a small colon cancer or polyp, and the risk of death. All questions were answered and the patient acknowledges these risks and wishes to proceed.    Leslie Soto D 12/07/2017, 9:10 AM

## 2017-12-07 NOTE — Transfer of Care (Signed)
Immediate Anesthesia Transfer of Care Note  Patient: Leslie Soto  Procedure(s) Performed: ESOPHAGOGASTRODUODENOSCOPY (EGD) WITH PROPOFOL (N/A ) COLONOSCOPY WITH PROPOFOL (N/A )  Patient Location: PACU  Anesthesia Type:MAC  Level of Consciousness: awake, alert  and oriented  Airway & Oxygen Therapy: Patient Spontanous Breathing and Patient connected to face mask oxygen  Post-op Assessment: Report given to RN  Post vital signs: Reviewed and stable  Last Vitals:  Vitals:   12/07/17 0904  BP: (!) 157/91  Pulse: 63  Resp: 16  Temp: 36.8 C  SpO2: 95%    Last Pain:  Vitals:   12/07/17 0904  TempSrc: Oral  PainSc: 0-No pain         Complications: No apparent anesthesia complications

## 2017-12-07 NOTE — Op Note (Signed)
[facility] Patient Name: Leslie Soto Procedure Date: 12/07/2017 MRN: [id number] Attending MD: Carol Ada , MD Date of Birth: 04-01-1961 CSN: [id number] Age: 56 Admit Type: Outpatient Procedure:                Upper GI endoscopy Indications:              Epigastric abdominal pain Providers:                Carol Ada, MD, Carolynn Comment RN, RN, Danford Bad, Technician, Arnoldo Hooker, CRNA Referring MD:              Medicines:                Propofol per Anesthesia Complications:            No immediate complications. Estimated Blood Loss:     Estimated blood loss was minimal. Procedure:                Pre-Anesthesia Assessment:                           - Prior to the procedure, a History and Physical                            was performed, and patient medications and                            allergies were reviewed. The patient's tolerance of                            previous anesthesia was also reviewed. The risks                            and benefits of the procedure and the sedation                            options and risks were discussed with the patient.                            All questions were answered, and informed consent                            was obtained. Prior Anticoagulants: The patient has                            taken no previous anticoagulant or antiplatelet                            agents. ASA Grade Assessment: III - A patient with                            severe systemic disease. After reviewing the risks  and benefits, the patient was deemed in                            satisfactory condition to undergo the procedure.                           - Sedation was administered by an anesthesia                            professional. Deep sedation was attained.                           After obtaining informed consent, the endoscope was   passed under direct vision. Throughout the                            procedure, the patient's blood pressure, pulse, and                            oxygen saturations were monitored continuously. The                            EC-3890LI ([id number]) scope was introduced through                            the mouth, and advanced to the second part of                            duodenum. The upper GI endoscopy was accomplished                            without difficulty. The patient tolerated the                            procedure well. Scope In: Scope Out: Findings:      The esophagus was normal.      The stomach was normal.      A single 20 mm pedunculated polyp with no bleeding was found in the       duodenal bulb. Biopsies were taken with a cold forceps for histology.       The lesion had a wider pedunculated base. It was mobile, but firm to       palpation with the biopsy forceps. Impression:               - Normal esophagus.                           - Normal stomach.                           - A single duodenal polyp. Biopsied. Moderate Sedation:      None Recommendation:           - Patient has a contact number available for                            emergencies.  The signs and symptoms of potential                            delayed complications were discussed with the                            patient. Return to normal activities tomorrow.                            Written discharge instructions were provided to the                            patient.                           - Resume previous diet.                           - Continue present medications.                           - Await pathology results.                           - ? EUS, pending the biopsy results. Procedure Code(s):        --- Professional ---                           [id number], Esophagogastroduodenoscopy, flexible,                            transoral; with biopsy, single or multiple Diagnosis  Code(s):        --- Professional ---                           K31.7, Polyp of stomach and duodenum                           R10.13, Epigastric pain CPT copyright 2016 American [employer]. All rights reserved. The codes documented in this report are preliminary and upon coder review may  be revised to meet current compliance requirements. Carol Ada, MD Carol Ada, MD 12/07/2017 10:43:18 AM This report has been signed electronically. Number of Addenda: 0

## 2017-12-07 NOTE — Discharge Instructions (Signed)

## 2017-12-07 NOTE — H&P (Signed)
Leslie Soto HPI: For the past two weeks the patient reports having hematochezia and he also has some abdominal pain. The pain is in the mid abdomen. There does not appear to be a correlation with his abdominal pain and the hematochezia. When he was evaluated in the past he did have epigastric pain. The patient's maternal grandmother had colon cancer in her 55's.   Past Medical History:  Diagnosis Date  . Abnormal EKG    hx right bundle branck block on ekg  . Asthma    cold weather induced  . Degenerative disc disease    ?  . GI bleed due to NSAIDs   . History of kidney stones   . Hypertension   . Lower back pain   . Obesity (BMI 35.0-39.9 without comorbidity)   . OSA (obstructive sleep apnea)    Dr. Vertell Limber, [place]-uses cpap at night  set on 10-8  . Pneumonia 8 years ago   walking pneumonia   . Spina bifida (Leslie Soto)    L 2 or L 3 defect creating left leg pain     Past Surgical History:  Procedure Laterality Date  . CARDIOVASCULAR STRESS TEST  06/12/2012   Normal pattern of perfusion in all regions, no ECG changes, EKG negative for ischemia.  . CYSTOSCOPY/URETEROSCOPY/HOLMIUM LASER/STENT PLACEMENT Bilateral 01/31/2017   Procedure: CYSTOSCOPY/BILATERAL RETROGRADE PYELOGRAM/ LEFT URETEROSCOPYLEFT /HOLMIUM LASER/BILATERAL STENT PLACEMENT;  Surgeon: Alexis Frock, MD;  Location: [facility];  Service: Urology;  Laterality: Bilateral;  . CYSTOSCOPY/URETEROSCOPY/HOLMIUM LASER/STENT PLACEMENT Bilateral 02/21/2017   Procedure: CYSTOSCOPY/URETEROSCOPY/HOLMIUM LASER/STENT REPLACEMENT BILATERAL RETROGRADE;  Surgeon: Alexis Frock, MD;  Location: [facility];  Service: Urology;  Laterality: Bilateral;  . ESOPHAGOGASTRODUODENOSCOPY  08/28/2012   Procedure: ESOPHAGOGASTRODUODENOSCOPY (EGD);  Surgeon: Inda Castle, MD;  Location: [facility];  Service: Endoscopy;  Laterality: N/A;  . EXTRACORPOREAL SHOCK WAVE LITHOTRIPSY     2003  . EYE SURGERY Bilateral    1969 weak eye muscle  . HERNIA REPAIR   09/20/1983   85, 84  . HERNIA REPAIR  09/25/1997  . KNEE ARTHROSCOPY Left 01/20/2011  . TRANSTHORACIC ECHOCARDIOGRAM  04/03/2013   EF 55-60%, moderate concentric hypertrophy,     Family History  Problem Relation Age of Onset  . Mental illness Father   . Heart disease Father   . Diabetes Father   . Hypertension Father   . Stroke Mother   . Hypertension Mother   . Hypertension Brother     Social History:  reports that  has never smoked. he has never used smokeless tobacco. He reports that he does not drink alcohol or use drugs.  Allergies: No Known Allergies  Medications:  Scheduled:  Continuous: . sodium chloride    . lactated ringers      No results found for this or any previous visit (from the past 24 hour(s)).   No results found.  ROS:  As stated above in the HPI otherwise negative.  Blood pressure (!) 157/91, pulse 63, temperature 98.2 F (36.8 C), temperature source Oral, resp. rate 16, height 5\' 10"  (1.778 m), weight (!) 162.8 kg (359 lb), SpO2 95 %.    PE: Gen: NAD, Alert and Oriented HEENT:  [place]/AT, EOMI Neck: Supple, no LAD Lungs: CTA Bilaterally CV: RRR without M/G/R ABM: Soft, NTND, +BS, morbidly obese Ext: No C/C/E  Assessment/Plan: 1) Hematochezia - From his description it appears to be hemorrhoidal in origin. From a screening standpoint he has been trying to schedule his procedure, but work is very busy.  2) CRC - I have discussed the risks of bleeding, infection, perforation, medication reactions, a 10% miss rate for a small colon cancer or polyp, and the risk of death. All questions were answered and the patient acknowledges these risks and wishes to proceed.    Leslie Soto 12/07/2017, 9:10 AM

## 2017-12-07 NOTE — Anesthesia Preprocedure Evaluation (Signed)
Anesthesia Evaluation  Patient identified by MRN, date of birth, ID band Patient awake    Reviewed: Allergy & Precautions, NPO status , Patient's Chart, lab work & pertinent test results  Airway Mallampati: I       Dental  (+) Poor Dentition,    Pulmonary    Pulmonary exam normal breath sounds clear to auscultation       Cardiovascular hypertension, Pt. on medications Normal cardiovascular exam Rhythm:Regular Rate:Normal     Neuro/Psych    GI/Hepatic   Endo/Other  Morbid obesity  Renal/GU      Musculoskeletal   Abdominal (+) + obese,   Peds  Hematology   Anesthesia Other Findings Leslie Soto  ECHO COMPLETE WO IMAGING ENHANCING AGENT  Order# [id number]  Reading physician: Skeet Latch, MD Ordering physician: Troy Sine, MD Study date: 04/04/17 Result Notes for ECHOCARDIOGRAM COMPLETE   Notes recorded by Troy Sine, MD on 04/08/2017 at 9:32 PM EDT Normal EF, normal wall motion. Mild diastolic relaxation impairment   Study Result   Result status: Final result                          [name]                        [address], [place] [address]                            [phone]  ------------------------------------------------------------------- Transthoracic Echocardiography  Patient:    Leslie Soto, Leslie Soto MR #:       [id number] Study Date: 04/04/2017 Gender:     M Age:        56 Height:     177.8 cm Weight:     161.9 kg BSA:        2.92 m^2 Pt. Status: Room:   ORDERING     Shelva Majestic, M.D.  REFERRING    Shelva Majestic, M.D.  REFERRING    Thressa Sheller [id number]  SONOGRAPHER  Marygrace Drought, RCS  PERFORMING   [facility], Outpatient  ATTENDING    Skeet Latch, MD  cc:  ------------------------------------------------------------------- LV EF: 55% -    60%  ------------------------------------------------------------------- Indications:      Hypertension (I10).  ------------------------------------------------------------------- History:   PMH:  OSA.  Atrial fibrillation.  ------------------------------------------------------------------- Study Conclusions  - Left ventricle: The cavity size was normal. There was mild to   moderate concentric hypertrophy. Systolic function was normal.   The     Reproductive/Obstetrics                             Anesthesia Physical Anesthesia Plan  ASA: III  Anesthesia Plan: MAC   Post-op Pain Management:    Induction:   PONV Risk Score and Plan: 1  Airway Management Planned: Mask and Natural Airway  Additional Equipment:   Intra-op Plan:   Post-operative Plan:   Informed Consent: I have reviewed the patients History and Physical, chart, labs and discussed the procedure including the risks, benefits and alternatives for the proposed anesthesia with the patient or authorized representative who has indicated his/her understanding and acceptance.     Plan Discussed with: CRNA and Surgeon  Anesthesia Plan Comments:         Anesthesia Quick Evaluation

## 2017-12-07 NOTE — Op Note (Signed)
[facility] Patient Name: Leslie Soto Procedure Date: 12/07/2017 MRN: [id number] Attending MD: Carol Ada , MD Date of Birth: [birth year] CSN: [id number] Age: 56 Admit Type: Outpatient Procedure:                Colonoscopy Indications:              Screening for colorectal malignant neoplasm Providers:                Carol Ada, MD, Carolynn Comment RN, RN, Danford Bad, Technician Referring MD:              Medicines:                Propofol per Anesthesia Complications:            No immediate complications. Estimated Blood Loss:     Estimated blood loss was minimal. Procedure:                Pre-Anesthesia Assessment:                           - Prior to the procedure, a History and Physical                            was performed, and patient medications and                            allergies were reviewed. The patient's tolerance of                            previous anesthesia was also reviewed. The risks                            and benefits of the procedure and the sedation                            options and risks were discussed with the patient.                            All questions were answered, and informed consent                            was obtained. Prior Anticoagulants: The patient has                            taken no previous anticoagulant or antiplatelet                            agents. ASA Grade Assessment: III - A patient with                            severe systemic disease. After reviewing the risks  and benefits, the patient was deemed in                            satisfactory condition to undergo the procedure.                           - Sedation was administered by an anesthesia                            professional. Deep sedation was attained.                           After obtaining informed consent, the colonoscope                            was passed  under direct vision. Throughout the                            procedure, the patient's blood pressure, pulse, and                            oxygen saturations were monitored continuously. The                            EC-3890LI ([id number]) scope was introduced through                            the anus and advanced to the the cecum, identified                            by appendiceal orifice and ileocecal valve. The                            colonoscopy was performed without difficulty. The                            patient tolerated the procedure well. The quality                            of the bowel preparation was good. The ileocecal                            valve, appendiceal orifice, and rectum were                            photographed. Scope In: 10:16:07 AM Scope Out: 10:35:40 AM Scope Withdrawal Time: 0 hours 14 minutes 39 seconds  Total Procedure Duration: 0 hours 19 minutes 33 seconds  Findings:      A 3 mm polyp was found in the ascending colon. The polyp was sessile.       The polyp was removed with a cold snare. Resection and retrieval were       complete.      Scattered small and large-mouthed diverticula were found in the sigmoid       colon.  Impression:               - One 3 mm polyp in the ascending colon, removed                            with a cold snare. Resected and retrieved.                           - Diverticulosis in the sigmoid colon. Moderate Sedation:      None Recommendation:           - Patient has a contact number available for                            emergencies. The signs and symptoms of potential                            delayed complications were discussed with the                            patient. Return to normal activities tomorrow.                            Written discharge instructions were provided to the                            patient.                           - Resume previous diet.                           -  Continue present medications.                           - Await pathology results.                           - Repeat colonoscopy in 5-10 years for surveillance. Procedure Code(s):        --- Professional ---                           [phone], Colonoscopy, flexible; with removal of                            tumor(s), polyp(s), or other lesion(s) by snare                            technique Diagnosis Code(s):        --- Professional ---                           Z12.11, Encounter for screening for malignant                            neoplasm of colon  D12.2, Benign neoplasm of ascending colon                           K57.30, Diverticulosis of large intestine without                            perforation or abscess without bleeding CPT copyright 2016 American Medical Association. All rights reserved. The codes documented in this report are preliminary and upon coder review may  be revised to meet current compliance requirements. Carol Ada, MD Carol Ada, MD 12/07/2017 10:45:32 AM This report has been signed electronically. Number of Addenda: 0

## 2017-12-09 ENCOUNTER — Encounter (HOSPITAL_COMMUNITY): Payer: Self-pay | Admitting: Gastroenterology

## 2017-12-14 ENCOUNTER — Other Ambulatory Visit: Payer: Self-pay | Admitting: Gastroenterology

## 2017-12-17 DIAGNOSIS — I10 Essential (primary) hypertension: Secondary | ICD-10-CM | POA: Diagnosis not present

## 2017-12-17 DIAGNOSIS — Z23 Encounter for immunization: Secondary | ICD-10-CM | POA: Diagnosis not present

## 2017-12-17 DIAGNOSIS — Z9989 Dependence on other enabling machines and devices: Secondary | ICD-10-CM | POA: Diagnosis not present

## 2017-12-17 DIAGNOSIS — E559 Vitamin D deficiency, unspecified: Secondary | ICD-10-CM | POA: Diagnosis not present

## 2017-12-26 ENCOUNTER — Encounter (HOSPITAL_COMMUNITY): Payer: Self-pay | Admitting: Emergency Medicine

## 2017-12-26 ENCOUNTER — Other Ambulatory Visit: Payer: Self-pay

## 2017-12-31 DIAGNOSIS — J019 Acute sinusitis, unspecified: Secondary | ICD-10-CM | POA: Diagnosis not present

## 2017-12-31 DIAGNOSIS — I10 Essential (primary) hypertension: Secondary | ICD-10-CM | POA: Diagnosis not present

## 2018-01-03 NOTE — Anesthesia Preprocedure Evaluation (Addendum)
Anesthesia Evaluation  Patient identified by MRN, date of birth, ID band Patient awake    Reviewed: Allergy & Precautions, NPO status , Patient's Chart, lab work & pertinent test results  Airway Mallampati: I       Dental  (+) Poor Dentition,    Pulmonary    Pulmonary exam normal breath sounds clear to auscultation       Cardiovascular hypertension, Pt. on medications Normal cardiovascular exam Rhythm:Regular Rate:Normal  Echo 4/18 Left ventricle:  The cavity size was normal. There was mild to moderate concentric hypertrophy. Systolic function was normal. The estimated ejection fraction was in the range of 55% to 60%. Wall motion was normal; there were no regional wall motion abnormalities. Doppler parameters are consistent with abnormal left ventricular relaxation (grade 1 diastolic dysfunction). Doppler parameters are consistent with indeterminate ventricular filling pressure.   Neuro/Psych    GI/Hepatic   Endo/Other  Morbid obesity  Renal/GU      Musculoskeletal   Abdominal (+) + obese,   Peds  Hematology   Anesthesia Other Findings    Reproductive/Obstetrics                            Anesthesia Physical  Anesthesia Plan  ASA: III  Anesthesia Plan: MAC   Post-op Pain Management:    Induction:   PONV Risk Score and Plan: 1  Airway Management Planned: Mask, Natural Airway and Nasal Cannula  Additional Equipment:   Intra-op Plan:   Post-operative Plan:   Informed Consent: I have reviewed the patients History and Physical, chart, labs and discussed the procedure including the risks, benefits and alternatives for the proposed anesthesia with the patient or authorized representative who has indicated his/her understanding and acceptance.     Plan Discussed with: CRNA, Surgeon and Anesthesiologist  Anesthesia Plan Comments:         Anesthesia Quick Evaluation

## 2018-01-04 ENCOUNTER — Encounter (HOSPITAL_COMMUNITY)
Admission: RE | Disposition: A | Discharge status: Home / Self Care | Payer: Self-pay | Source: Ambulatory Visit | Attending: Gastroenterology

## 2018-01-04 ENCOUNTER — Ambulatory Visit (HOSPITAL_COMMUNITY)
Admission: RE | Admit: 2018-01-04 | Discharge: 2018-01-04 | Disposition: A | Discharge status: Home / Self Care | Payer: BLUE CROSS/BLUE SHIELD | Source: Ambulatory Visit | Attending: Gastroenterology | Admitting: Gastroenterology

## 2018-01-04 ENCOUNTER — Other Ambulatory Visit: Payer: Self-pay

## 2018-01-04 ENCOUNTER — Ambulatory Visit (HOSPITAL_COMMUNITY): Payer: BLUE CROSS/BLUE SHIELD | Admitting: Anesthesiology

## 2018-01-04 ENCOUNTER — Encounter (HOSPITAL_COMMUNITY): Payer: Self-pay | Admitting: *Deleted

## 2018-01-04 DIAGNOSIS — D62 Acute posthemorrhagic anemia: Secondary | ICD-10-CM | POA: Diagnosis not present

## 2018-01-04 DIAGNOSIS — J45909 Unspecified asthma, uncomplicated: Secondary | ICD-10-CM | POA: Diagnosis not present

## 2018-01-04 DIAGNOSIS — D132 Benign neoplasm of duodenum: Secondary | ICD-10-CM | POA: Diagnosis not present

## 2018-01-04 DIAGNOSIS — I10 Essential (primary) hypertension: Secondary | ICD-10-CM | POA: Insufficient documentation

## 2018-01-04 DIAGNOSIS — Z79899 Other long term (current) drug therapy: Secondary | ICD-10-CM | POA: Diagnosis not present

## 2018-01-04 DIAGNOSIS — Z800 Family history of malignant neoplasm of digestive organs: Secondary | ICD-10-CM | POA: Insufficient documentation

## 2018-01-04 DIAGNOSIS — G4733 Obstructive sleep apnea (adult) (pediatric): Secondary | ICD-10-CM | POA: Diagnosis not present

## 2018-01-04 DIAGNOSIS — K254 Chronic or unspecified gastric ulcer with hemorrhage: Secondary | ICD-10-CM | POA: Diagnosis not present

## 2018-01-04 DIAGNOSIS — Z87442 Personal history of urinary calculi: Secondary | ICD-10-CM | POA: Insufficient documentation

## 2018-01-04 DIAGNOSIS — E669 Obesity, unspecified: Secondary | ICD-10-CM | POA: Insufficient documentation

## 2018-01-04 DIAGNOSIS — K317 Polyp of stomach and duodenum: Secondary | ICD-10-CM | POA: Diagnosis not present

## 2018-01-04 DIAGNOSIS — K3189 Other diseases of stomach and duodenum: Secondary | ICD-10-CM | POA: Diagnosis not present

## 2018-01-04 HISTORY — PX: EUS: SHX5427

## 2018-01-04 HISTORY — PX: FINE NEEDLE ASPIRATION: SHX5430

## 2018-01-04 SURGERY — UPPER ENDOSCOPIC ULTRASOUND (EUS) LINEAR
Anesthesia: Monitor Anesthesia Care

## 2018-01-04 MED ORDER — ONDANSETRON HCL 4 MG/2ML IJ SOLN: 4.0000 mg | Freq: Once | INTRAMUSCULAR | Status: DC | PRN

## 2018-01-04 MED ORDER — OXYCODONE HCL 5 MG/5ML PO SOLN: 5.0000 mg | Freq: Once | ORAL | Status: DC | PRN

## 2018-01-04 MED ORDER — MEPERIDINE HCL 25 MG/ML IJ SOLN: 6.2500 mg | INTRAMUSCULAR | Status: DC | PRN

## 2018-01-04 MED ORDER — PROPOFOL 10 MG/ML IV BOLUS
INTRAVENOUS | Status: AC
  Filled 2018-01-04: qty 20

## 2018-01-04 MED ORDER — OXYCODONE HCL 5 MG PO TABS: 5.0000 mg | ORAL_TABLET | Freq: Once | ORAL | Status: DC | PRN

## 2018-01-04 MED ORDER — PROPOFOL 500 MG/50ML IV EMUL
INTRAVENOUS | Status: DC | PRN
  Administered 2018-01-04: 150 ug/kg/min via INTRAVENOUS

## 2018-01-04 MED ORDER — LACTATED RINGERS IV SOLN
INTRAVENOUS | Status: DC
  Administered 2018-01-04 (×2): via INTRAVENOUS

## 2018-01-04 MED ORDER — LIDOCAINE HCL (CARDIAC) 20 MG/ML IV SOLN
INTRAVENOUS | Status: DC | PRN
  Administered 2018-01-04: 60 mg via INTRAVENOUS

## 2018-01-04 MED ORDER — ACETAMINOPHEN 160 MG/5ML PO SOLN
325.0000 mg | ORAL | Status: DC | PRN
  Filled 2018-01-04: qty 20.3

## 2018-01-04 MED ORDER — ACETAMINOPHEN 325 MG PO TABS
325.0000 mg | ORAL_TABLET | ORAL | Status: DC | PRN
  Filled 2018-01-04: qty 2

## 2018-01-04 MED ORDER — FENTANYL CITRATE (PF) 100 MCG/2ML IJ SOLN: 25.0000 ug | INTRAMUSCULAR | Status: DC | PRN

## 2018-01-04 MED ORDER — KETOROLAC TROMETHAMINE 30 MG/ML IJ SOLN
30.0000 mg | Freq: Once | INTRAMUSCULAR | Status: DC | PRN
  Filled 2018-01-04: qty 1

## 2018-01-04 MED ORDER — PROPOFOL 500 MG/50ML IV EMUL
INTRAVENOUS | Status: DC | PRN
  Administered 2018-01-04: 30 mg via INTRAVENOUS
  Administered 2018-01-04: 100 mg via INTRAVENOUS
  Administered 2018-01-04: 30 mg via INTRAVENOUS

## 2018-01-04 MED ORDER — PROPOFOL 10 MG/ML IV BOLUS
INTRAVENOUS | Status: AC
Start: 2018-01-04 — End: 2018-01-04
  Filled 2018-01-04: qty 20

## 2018-01-04 MED ORDER — PROPOFOL 10 MG/ML IV BOLUS
INTRAVENOUS | Status: AC
  Filled 2018-01-04: qty 60

## 2018-01-04 MED ORDER — SODIUM CHLORIDE 0.9 % IV SOLN: INTRAVENOUS | Status: DC

## 2018-01-04 NOTE — Interval H&P Note (Signed)
History and Physical Interval Note:  01/04/2018 8:41 AM  Leslie Soto  has presented today for surgery, with the diagnosis of pedunculated polyp in the duodenal bulb  The various methods of treatment have been discussed with the patient and family. After consideration of risks, benefits and other options for treatment, the patient has consented to  Procedure(s): UPPER ENDOSCOPIC ULTRASOUND (EUS) LINEAR (N/A) FINE NEEDLE ASPIRATION (FNA) LINEAR (N/A) as a surgical intervention .  The patient's history has been reviewed, patient examined, no change in status, stable for surgery.  I have reviewed the patient's chart and labs.  Questions were answered to the patient's satisfaction.     [name]

## 2018-01-04 NOTE — Anesthesia Postprocedure Evaluation (Signed)
Anesthesia Post Note  Patient: Leslie Soto  Procedure(s) Performed: UPPER ENDOSCOPIC ULTRASOUND (EUS) LINEAR (N/A ) FINE NEEDLE ASPIRATION (FNA) LINEAR (N/A )     Patient location during evaluation: PACU Anesthesia Type: MAC Level of consciousness: awake and alert Pain management: pain level controlled Vital Signs Assessment: post-procedure vital signs reviewed and stable Respiratory status: spontaneous breathing, nonlabored ventilation, respiratory function stable and patient connected to nasal cannula oxygen Cardiovascular status: stable and blood pressure returned to baseline Postop Assessment: no apparent nausea or vomiting Anesthetic complications: no    Last Vitals:  Vitals:   01/04/18 1108 01/04/18 1120  BP: (!) 141/73 (!) 142/71  Pulse: (!) 58 (!) 47  Resp: 20 18  Temp: 36.7 C   SpO2: 95% 95%    Last Pain:  Vitals:   01/04/18 1108  TempSrc: Oral  PainSc: 6                  Leeyah Heather

## 2018-01-04 NOTE — Op Note (Addendum)
[facility] Patient Name: Leslie Soto Procedure Date: 01/04/2018 MRN: [id number] Attending MD: Carol Ada , MD Date of Birth: 05/09/1961 CSN: [id number] Age: 57 Admit Type: Outpatient Procedure:                Upper EUS Indications:              Duodenal mucosal mass/polyp found on endoscopy Providers:                Carol Ada, MD, Elna Breslow, RN, Danford Bad, Technician Referring MD:              Medicines:                Propofol per Anesthesia Complications:            No immediate complications. Estimated Blood Loss:     Estimated blood loss was minimal. Procedure:                Pre-Anesthesia Assessment:                           - Prior to the procedure, a History and Physical                            was performed, and patient medications and                            allergies were reviewed. The patient's tolerance of                            previous anesthesia was also reviewed. The risks                            and benefits of the procedure and the sedation                            options and risks were discussed with the patient.                            All questions were answered, and informed consent                            was obtained. Prior Anticoagulants: The patient has                            taken no previous anticoagulant or antiplatelet                            agents. ASA Grade Assessment: II - A patient with                            mild systemic disease. After reviewing the risks  and benefits, the patient was deemed in                            satisfactory condition to undergo the procedure.                           - Sedation was administered by an anesthesia                            professional. Deep sedation was attained.                           After obtaining informed consent, the endoscope was                            passed under  direct vision. Throughout the                            procedure, the patient's blood pressure, pulse, and                            oxygen saturations were monitored continuously. The                            [id number] ([id number]) scope was introduced through                            the mouth, and advanced to the second part of                            duodenum. The upper EUS was accomplished without                            difficulty. The patient tolerated the procedure                            well. Scope In: Scope Out: Findings:      Endosonographic Finding :      A heterogenous pedunculated mass was identified endosonographically in       the duodenal bulb. The mass measured 23 mm by 17 mm in maximal       cross-sectional diameter. The endosonographic borders were well-defined.       Fine needle aspiration for cytology was performed. Color Doppler imaging       was utilized prior to needle puncture to confirm a lack of significant       vascular structures within the needle path. Five passes were made with       the 25 gauge needle using a transduodenal approach. A stylet was used. A       cytotechnologist was present to evaluate the adequacy of the specimen.       Final cytology results are pending.      The polypoid lesion was identified just distal to the pylorus in the       duodenal bulb. The lesion measured 23 x 17 mm and it was mobile.  Obtaining a view of the base to determine the layer of origin was not       possible as a result of the mobility of the lesion and the location.       With EUS the lesion was noted to have a mixed echogenic signal. Impression:               - A mass was found in the duodenal bulb. Fine                            needle aspiration performed. Moderate Sedation:      N/A- Per Anesthesia Care Recommendation:           - Patient has a contact number available for                            emergencies. The signs and symptoms of  potential                            delayed complications were discussed with the                            patient. Return to normal activities tomorrow.                            Written discharge instructions were provided to the                            patient.                           - Resume regular diet.                           - Await cytology results. Procedure Code(s):        --- Professional ---                           [phone], Esophagogastroduodenoscopy, flexible,                            transoral; with transendoscopic ultrasound-guided                            intramural or transmural fine needle                            aspiration/biopsy(s), (includes endoscopic                            ultrasound examination limited to the esophagus,                            stomach or duodenum, and adjacent structures) Diagnosis Code(s):        --- Professional ---                           K31.89, Other diseases  of stomach and duodenum CPT copyright 2016 [employer]. All rights reserved. The codes documented in this report are preliminary and upon coder review may  be revised to meet current compliance requirements. Carol Ada, MD Carol Ada, MD 01/04/2018 11:13:57 AM This report has been signed electronically. Number of Addenda: 0

## 2018-01-04 NOTE — Discharge Instructions (Signed)

## 2018-01-04 NOTE — Transfer of Care (Signed)
Immediate Anesthesia Transfer of Care Note  Patient: Leslie Soto  Procedure(s) Performed: UPPER ENDOSCOPIC ULTRASOUND (EUS) LINEAR (N/A ) FINE NEEDLE ASPIRATION (FNA) LINEAR (N/A )  Patient Location: PACU and Endoscopy Unit  Anesthesia Type:MAC  Level of Consciousness: awake, alert  and oriented  Airway & Oxygen Therapy: Patient Spontanous Breathing and Patient connected to nasal cannula oxygen  Post-op Assessment: Report given to RN and Post -op Vital signs reviewed and stable  Post vital signs: Reviewed and stable  Last Vitals:  Vitals:   01/04/18 0845  BP: (!) 170/87  Pulse: (!) 59  Resp: 17  Temp: 36.6 C  SpO2: 98%    Last Pain:  Vitals:   01/04/18 0845  TempSrc: Oral  PainSc: 3          Complications: No apparent anesthesia complications

## 2018-01-07 ENCOUNTER — Encounter (HOSPITAL_COMMUNITY): Payer: Self-pay | Admitting: Gastroenterology

## 2018-02-08 DIAGNOSIS — I10 Essential (primary) hypertension: Secondary | ICD-10-CM | POA: Diagnosis not present

## 2018-02-08 DIAGNOSIS — K529 Noninfective gastroenteritis and colitis, unspecified: Secondary | ICD-10-CM | POA: Diagnosis not present

## 2018-02-18 DIAGNOSIS — M5442 Lumbago with sciatica, left side: Secondary | ICD-10-CM | POA: Diagnosis not present

## 2018-02-18 DIAGNOSIS — G8929 Other chronic pain: Secondary | ICD-10-CM | POA: Diagnosis not present

## 2018-02-19 DIAGNOSIS — M5136 Other intervertebral disc degeneration, lumbar region: Secondary | ICD-10-CM | POA: Diagnosis not present

## 2018-02-22 DIAGNOSIS — M545 Low back pain: Secondary | ICD-10-CM | POA: Diagnosis not present

## 2018-02-26 DIAGNOSIS — M545 Low back pain: Secondary | ICD-10-CM | POA: Diagnosis not present

## 2018-03-12 DIAGNOSIS — M5136 Other intervertebral disc degeneration, lumbar region: Secondary | ICD-10-CM | POA: Diagnosis not present

## 2018-03-13 DIAGNOSIS — H029 Unspecified disorder of eyelid: Secondary | ICD-10-CM | POA: Diagnosis not present

## 2018-03-16 DIAGNOSIS — I10 Essential (primary) hypertension: Secondary | ICD-10-CM | POA: Diagnosis not present

## 2018-03-16 DIAGNOSIS — R0981 Nasal congestion: Secondary | ICD-10-CM | POA: Diagnosis not present

## 2018-03-16 DIAGNOSIS — H9201 Otalgia, right ear: Secondary | ICD-10-CM | POA: Diagnosis not present

## 2018-03-18 DIAGNOSIS — M5136 Other intervertebral disc degeneration, lumbar region: Secondary | ICD-10-CM | POA: Diagnosis not present

## 2018-03-19 DIAGNOSIS — H66003 Acute suppurative otitis media without spontaneous rupture of ear drum, bilateral: Secondary | ICD-10-CM | POA: Diagnosis not present

## 2018-03-27 DIAGNOSIS — H029 Unspecified disorder of eyelid: Secondary | ICD-10-CM | POA: Diagnosis not present

## 2018-03-29 DIAGNOSIS — H66003 Acute suppurative otitis media without spontaneous rupture of ear drum, bilateral: Secondary | ICD-10-CM | POA: Diagnosis not present

## 2018-04-18 DIAGNOSIS — M5136 Other intervertebral disc degeneration, lumbar region: Secondary | ICD-10-CM | POA: Diagnosis not present

## 2018-04-29 DIAGNOSIS — M545 Low back pain: Secondary | ICD-10-CM | POA: Diagnosis not present

## 2018-04-29 DIAGNOSIS — M5136 Other intervertebral disc degeneration, lumbar region: Secondary | ICD-10-CM | POA: Diagnosis not present

## 2018-06-28 DIAGNOSIS — R0602 Shortness of breath: Secondary | ICD-10-CM | POA: Diagnosis not present

## 2018-06-28 DIAGNOSIS — I4510 Unspecified right bundle-branch block: Secondary | ICD-10-CM | POA: Diagnosis not present

## 2018-06-28 DIAGNOSIS — I10 Essential (primary) hypertension: Secondary | ICD-10-CM | POA: Diagnosis not present

## 2018-07-09 DIAGNOSIS — E785 Hyperlipidemia, unspecified: Secondary | ICD-10-CM | POA: Diagnosis not present

## 2018-07-09 DIAGNOSIS — Z0000 Encounter for general adult medical examination without abnormal findings: Secondary | ICD-10-CM | POA: Diagnosis not present

## 2018-07-09 DIAGNOSIS — Z125 Encounter for screening for malignant neoplasm of prostate: Secondary | ICD-10-CM | POA: Diagnosis not present

## 2018-07-09 DIAGNOSIS — I10 Essential (primary) hypertension: Secondary | ICD-10-CM | POA: Diagnosis not present

## 2018-07-26 DIAGNOSIS — Z0001 Encounter for general adult medical examination with abnormal findings: Secondary | ICD-10-CM | POA: Diagnosis not present

## 2018-08-26 DIAGNOSIS — R509 Fever, unspecified: Secondary | ICD-10-CM | POA: Diagnosis not present

## 2018-08-26 DIAGNOSIS — N201 Calculus of ureter: Secondary | ICD-10-CM | POA: Diagnosis not present

## 2018-09-23 DIAGNOSIS — N200 Calculus of kidney: Secondary | ICD-10-CM | POA: Diagnosis not present

## 2018-09-30 DIAGNOSIS — N200 Calculus of kidney: Secondary | ICD-10-CM | POA: Diagnosis not present

## 2018-12-30 DIAGNOSIS — N200 Calculus of kidney: Secondary | ICD-10-CM | POA: Diagnosis not present

## 2018-12-30 DIAGNOSIS — Z8042 Family history of malignant neoplasm of prostate: Secondary | ICD-10-CM | POA: Diagnosis not present

## 2019-01-06 DIAGNOSIS — J019 Acute sinusitis, unspecified: Secondary | ICD-10-CM | POA: Diagnosis not present

## 2019-01-10 DIAGNOSIS — H9012 Conductive hearing loss, unilateral, left ear, with unrestricted hearing on the contralateral side: Secondary | ICD-10-CM | POA: Diagnosis not present

## 2019-01-10 DIAGNOSIS — H6982 Other specified disorders of Eustachian tube, left ear: Secondary | ICD-10-CM | POA: Diagnosis not present

## 2019-01-20 DIAGNOSIS — I10 Essential (primary) hypertension: Secondary | ICD-10-CM | POA: Diagnosis not present

## 2019-01-20 DIAGNOSIS — R7309 Other abnormal glucose: Secondary | ICD-10-CM | POA: Diagnosis not present

## 2019-01-20 DIAGNOSIS — E785 Hyperlipidemia, unspecified: Secondary | ICD-10-CM | POA: Diagnosis not present

## 2019-01-27 DIAGNOSIS — I10 Essential (primary) hypertension: Secondary | ICD-10-CM | POA: Diagnosis not present

## 2019-01-27 DIAGNOSIS — R7309 Other abnormal glucose: Secondary | ICD-10-CM | POA: Diagnosis not present

## 2019-02-18 ENCOUNTER — Encounter: Payer: Self-pay | Admitting: Cardiology

## 2019-02-18 DIAGNOSIS — R0602 Shortness of breath: Secondary | ICD-10-CM | POA: Insufficient documentation

## 2019-02-18 DIAGNOSIS — R002 Palpitations: Secondary | ICD-10-CM

## 2019-02-18 DIAGNOSIS — Z0189 Encounter for other specified special examinations: Secondary | ICD-10-CM

## 2019-02-18 DIAGNOSIS — J45909 Unspecified asthma, uncomplicated: Secondary | ICD-10-CM

## 2019-02-18 DIAGNOSIS — I4510 Unspecified right bundle-branch block: Secondary | ICD-10-CM

## 2019-02-18 HISTORY — DX: Encounter for other specified special examinations: Z01.89

## 2019-02-18 HISTORY — DX: Unspecified right bundle-branch block: I45.10

## 2019-02-18 HISTORY — DX: Shortness of breath: R06.02

## 2019-02-18 HISTORY — DX: Unspecified asthma, uncomplicated: J45.909

## 2019-02-18 HISTORY — DX: Palpitations: R00.2

## 2019-02-18 NOTE — Progress Notes (Signed)
Subjective:   Leslie Soto, male    DOB: Apr 07, 1961, 58 y.o.   MRN: [id number]  Deland Pretty, MD:  Chief Complaint  Patient presents with  . Palpitations  . Follow-up  . Weight Gain    HPI: Leslie Soto  is a 58 y.o. male  with past medical history of morbid obesity, hypertension, atrial bigeminy noted on EKG during colonoscopy, obstructive sleep apnea on CPAP therapy and has had recurrent episodes of nephrolithiasis. He underwent nuclear perfusion study in June 2013 which revealed normal perfusion. Recent echocardiogram in April 2018 showed ejection fraction of 55-60% with normal wall motion and grade 1 diastolic dysfunction. No significant valvular abnormality. He has also been noted to have right bundle branch block since January 2018.  Patient presents today for 6 month follow up to improve compliance with weight loss and follow up on palpitations. Patient is doing well, is actually asking if he can work more hours. States that his new facility has a gym and is hoping to be able to use this once open. He has not lost any weight. States that he stays fairly busy with his job. Palpitations have been stable and actually feels like they may have improved. Denies any exertional chest pain or worsening shortness of breath. Occasional leg edema.   He reports that he has recently had a physical and labs with PCP. Reports that his labs were all within normal limits. Tolerating medications well.    Past Medical History:  Diagnosis Date  . Abnormal EKG    hx right bundle branck block on ekg  . Asthma    cold weather induced  . Bronchial asthma 02/18/2019  . Degenerative disc disease    ?  . GI bleed due to NSAIDs   . History of kidney stones   . Hypertension   . Laboratory examination 02/18/2019  . Lower back pain   . Obesity (BMI 35.0-39.9 without comorbidity)   . OSA (obstructive sleep apnea)    Dr. Vertell Limber, [place]-uses cpap at night  set on 10-8  . Palpitations 02/18/2019    . Pneumonia 8 years ago   walking pneumonia   . Right bundle branch block (RBBB) 02/18/2019  . Shortness of breath 02/18/2019  . Spina bifida ([place])    L 2 or L 3 defect creating left leg pain     Past Surgical History:  Procedure Laterality Date  . CARDIOVASCULAR STRESS TEST  06/12/2012   Normal pattern of perfusion in all regions, no ECG changes, EKG negative for ischemia.  . COLONOSCOPY WITH PROPOFOL N/A 12/07/2017   Procedure: COLONOSCOPY WITH PROPOFOL;  Surgeon: Carol Ada, MD;  Location: [facility];  Service: Endoscopy;  Laterality: N/A;  . CYSTOSCOPY/URETEROSCOPY/HOLMIUM LASER/STENT PLACEMENT Bilateral 01/31/2017   Procedure: CYSTOSCOPY/BILATERAL RETROGRADE PYELOGRAM/ LEFT URETEROSCOPYLEFT /HOLMIUM LASER/BILATERAL STENT PLACEMENT;  Surgeon: Alexis Frock, MD;  Location: [facility];  Service: Urology;  Laterality: Bilateral;  . CYSTOSCOPY/URETEROSCOPY/HOLMIUM LASER/STENT PLACEMENT Bilateral 02/21/2017   Procedure: CYSTOSCOPY/URETEROSCOPY/HOLMIUM LASER/STENT REPLACEMENT BILATERAL RETROGRADE;  Surgeon: Alexis Frock, MD;  Location: [facility];  Service: Urology;  Laterality: Bilateral;  . ESOPHAGOGASTRODUODENOSCOPY  08/28/2012   Procedure: ESOPHAGOGASTRODUODENOSCOPY (EGD);  Surgeon: Inda Castle, MD;  Location: [facility];  Service: Endoscopy;  Laterality: N/A;  . ESOPHAGOGASTRODUODENOSCOPY (EGD) WITH PROPOFOL N/A 12/07/2017   Procedure: ESOPHAGOGASTRODUODENOSCOPY (EGD) WITH PROPOFOL;  Surgeon: Carol Ada, MD;  Location: [facility];  Service: Endoscopy;  Laterality: N/A;  . EUS N/A 01/04/2018   Procedure: UPPER ENDOSCOPIC ULTRASOUND (EUS) LINEAR;  Surgeon: Carol Ada, MD;  Location: [facility];  Service: Endoscopy;  Laterality: N/A;  . EXTRACORPOREAL SHOCK WAVE LITHOTRIPSY     2003  . EYE SURGERY Bilateral    1969 weak eye muscle  . FINE NEEDLE ASPIRATION N/A 01/04/2018   Procedure: FINE NEEDLE ASPIRATION (FNA) LINEAR;  Surgeon: Carol Ada, MD;  Location: [facility];   Service: Endoscopy;  Laterality: N/A;  . HERNIA REPAIR  09/20/1983   85, 84  . HERNIA REPAIR  09/25/1997  . KNEE ARTHROSCOPY Left 01/20/2011  . TRANSTHORACIC ECHOCARDIOGRAM  04/03/2013   EF 55-60%, moderate concentric hypertrophy,     Family History  Problem Relation Age of Onset  . Mental illness Father   . Heart disease Father   . Diabetes Father   . Hypertension Father   . Stroke Mother   . Hypertension Mother   . Hypertension Brother     Social History   Socioeconomic History  . Marital status: Widowed    Spouse name: Not on file  . Number of children: 0  . Years of education: Not on file  . Highest education level: Not on file  Occupational History  . Not on file  Social Needs  . Financial resource strain: Not on file  . Food insecurity:    Worry: Not on file    Inability: Not on file  . Transportation needs:    Medical: Not on file    Non-medical: Not on file  Tobacco Use  . Smoking status: Never Smoker  . Smokeless tobacco: Never Used  Substance and Sexual Activity  . Alcohol use: No  . Drug use: No  . Sexual activity: Never  Lifestyle  . Physical activity:    Days per week: Not on file    Minutes per session: Not on file  . Stress: Not on file  Relationships  . Social connections:    Talks on phone: Not on file    Gets together: Not on file    Attends religious service: Not on file    Active member of club or organization: Not on file    Attends meetings of clubs or organizations: Not on file    Relationship status: Not on file  . Intimate partner violence:    Fear of current or ex partner: Not on file    Emotionally abused: Not on file    Physically abused: Not on file    Forced sexual activity: Not on file  Other Topics Concern  . Not on file  Social History Narrative  . Not on file    Current Meds  Medication Sig  . acetaminophen (TYLENOL) 500 MG tablet Take 500 mg by mouth 2 (two) times daily.   Marland Kitchen amLODipine (NORVASC) 10 MG tablet  Take 10 mg by mouth at bedtime.   . Ginger 500 MG CAPS Take by mouth 2 (two) times daily.  Marland Kitchen GLUCOSAMINE-CHONDROITIN PO Take 1 capsule by mouth 2 (two) times daily.   . metoprolol succinate (TOPROL XL) 50 MG 24 hr tablet Take 1 tablet in the morning and 1/2 tablet at night (Patient taking differently: 2 (two) times daily. )  . valsartan-hydrochlorothiazide (DIOVAN-HCT) 320-12.5 MG tablet Take 1 tablet by mouth daily.      Review of Systems  Constitution: Negative for decreased appetite, malaise/fatigue, weight gain and weight loss.  Eyes: Negative for visual disturbance.  Cardiovascular: Positive for dyspnea on exertion. Negative for chest pain, claudication, leg swelling, orthopnea, palpitations and syncope.  Respiratory: Negative for hemoptysis  and wheezing.   Endocrine: Negative for cold intolerance and heat intolerance.  Hematologic/Lymphatic: Does not bruise/bleed easily.  Skin: Negative for nail changes.  Musculoskeletal: Negative for muscle weakness and myalgias.  Gastrointestinal: Negative for abdominal pain, change in bowel habit, nausea and vomiting.  Neurological: Negative for difficulty with concentration, dizziness, focal weakness and headaches.  Psychiatric/Behavioral: Negative for altered mental status and suicidal ideas.  All other systems reviewed and are negative.      Objective:     Blood pressure (!) 159/103, pulse 64, height 5\' 6"  (1.676 m), weight (!) 365 lb 6.4 oz (165.7 kg), SpO2 98 %.  CARDIOVASCULAR STUDIES  EKG 02/19/2019: Normal sinus rhythm at 60 bpm with left atrial abnormality, RBBB. No change from EKG 06/28/2018.  Sleep Study [2012]: sleep apnea  Echocardiogram 04/04/2017: Left ventricle: The cavity size was normal. There was mild to moderate concentric hypertrophy. Systolic function was normal. The estimated ejection fraction was in the range of 55% to 60%. Wall motion was normal; there were no regional wall motion abnormalities. Doppler parameters  are consistent with abnormal left ventricular relaxation (grade 1 diastolic dysfunction). Doppler parameters are consistent with indeterminate ventricular filling pressure. Aortic valve: Transvalvular velocity was within the normal range. There was no stenosis. There was no regurgitation. Mitral valve: Transvalvular velocity was within the normal range. There was no evidence for stenosis. There was no regurgitation. Right ventricle: The cavity size was normal. Wall thickness was normal. Systolic function was normal. Atrial septum: No defect or patent foramen ovale was identified by color flow Doppler. Tricuspid valve: There was no regurgitation.  External Pharmacological Stress Test 06/12/2012: ECG Conclusions: Within normal limits. Normal pattern of perfusion in all myocardial regions.    Physical Exam  Constitutional: He is oriented to person, place, and time. Vital signs are normal. He appears well-developed and well-nourished.  Morbidly obese  HENT:  Head: Normocephalic and atraumatic.  Neck: Normal range of motion.  Cardiovascular: Normal rate, regular rhythm, normal heart sounds and intact distal pulses.  Pulses:      Femoral pulses are 1+ on the right side and 1+ on the left side.      Popliteal pulses are 1+ on the right side and 1+ on the left side.       Dorsalis pedis pulses are 2+ on the right side and 2+ on the left side.       Posterior tibial pulses are 2+ on the right side and 2+ on the left side.  Pulses difficult to palpate due to bodily habitus  Pulmonary/Chest: Effort normal and breath sounds normal. No accessory muscle usage. No respiratory distress.  Abdominal: Soft. Bowel sounds are normal.  Musculoskeletal: Normal range of motion.  Neurological: He is alert and oriented to person, place, and time.  Skin: Skin is warm and dry.  Vitals reviewed.          Assessment & Recommendations:   1. Palpitations Has remained stable and actually seemed to have  improve. Will continue to monitor.  2. Right bundle branch block (RBBB) Unchanged. Stable EKG  3. Essential hypertension Elevated in our office, but has been well controlled. He will continue to monitor.  4. Mixed hyperlipidemia Reports recent labs with PCP are stable. Will request for our records.  5. Morbidly obese Unfortunately, has not lost weight. I have stressed the importance of this and encouraged him to try to exercise at the gym at his job several days a week. Diet modifications discussed.  6. Shortness of  breath Remained stable. He is actually asking for extension on his work restriction as his symptoms have been stable. Will increase to 14 hrs with 30 min lunch. Encouraged him to contact me if symptoms worsen with increased work load.   7. Laboratory examination 05/30/2017: CBC normal.  Potassium 4.1, creatinine 0.9, CMP normal.  Cholesterol 151, triglycerides 145, HDL 79, LDL 93.      Jeri Lager, FNP-C [facility] [place] Office: [phone] Fax: [phone]

## 2019-02-19 ENCOUNTER — Encounter: Payer: Self-pay | Admitting: Cardiology

## 2019-02-19 ENCOUNTER — Ambulatory Visit: Payer: BLUE CROSS/BLUE SHIELD | Admitting: Cardiology

## 2019-02-19 VITALS — BP 148/96 | HR 63 | Ht 66.0 in | Wt 365.4 lb

## 2019-02-19 DIAGNOSIS — E782 Mixed hyperlipidemia: Secondary | ICD-10-CM | POA: Diagnosis not present

## 2019-02-19 DIAGNOSIS — I4510 Unspecified right bundle-branch block: Secondary | ICD-10-CM

## 2019-02-19 DIAGNOSIS — R0602 Shortness of breath: Secondary | ICD-10-CM

## 2019-02-19 DIAGNOSIS — R002 Palpitations: Secondary | ICD-10-CM

## 2019-02-19 DIAGNOSIS — Z0189 Encounter for other specified special examinations: Secondary | ICD-10-CM

## 2019-02-19 DIAGNOSIS — I10 Essential (primary) hypertension: Secondary | ICD-10-CM

## 2019-02-26 DIAGNOSIS — M545 Low back pain: Secondary | ICD-10-CM | POA: Diagnosis not present

## 2019-02-26 DIAGNOSIS — Z6841 Body Mass Index (BMI) 40.0 and over, adult: Secondary | ICD-10-CM | POA: Diagnosis not present

## 2019-03-06 ENCOUNTER — Other Ambulatory Visit: Payer: Self-pay

## 2019-03-06 ENCOUNTER — Encounter (INDEPENDENT_AMBULATORY_CARE_PROVIDER_SITE_OTHER): Payer: Self-pay

## 2019-03-10 DIAGNOSIS — M5136 Other intervertebral disc degeneration, lumbar region: Secondary | ICD-10-CM | POA: Diagnosis not present

## 2019-03-12 ENCOUNTER — Ambulatory Visit (INDEPENDENT_AMBULATORY_CARE_PROVIDER_SITE_OTHER): Payer: Self-pay | Admitting: Family Medicine

## 2019-03-18 DIAGNOSIS — M5136 Other intervertebral disc degeneration, lumbar region: Secondary | ICD-10-CM | POA: Diagnosis not present

## 2019-03-26 ENCOUNTER — Ambulatory Visit (INDEPENDENT_AMBULATORY_CARE_PROVIDER_SITE_OTHER): Payer: Self-pay | Admitting: Family Medicine

## 2019-04-01 DIAGNOSIS — M5416 Radiculopathy, lumbar region: Secondary | ICD-10-CM | POA: Diagnosis not present

## 2019-04-01 DIAGNOSIS — M5136 Other intervertebral disc degeneration, lumbar region: Secondary | ICD-10-CM | POA: Diagnosis not present

## 2019-04-01 DIAGNOSIS — M545 Low back pain: Secondary | ICD-10-CM | POA: Diagnosis not present

## 2019-04-23 DIAGNOSIS — K921 Melena: Secondary | ICD-10-CM | POA: Diagnosis not present

## 2019-04-23 DIAGNOSIS — D132 Benign neoplasm of duodenum: Secondary | ICD-10-CM | POA: Diagnosis not present

## 2019-04-28 ENCOUNTER — Telehealth (HOSPITAL_COMMUNITY): Payer: Self-pay

## 2019-04-28 NOTE — Telephone Encounter (Signed)
-----   Message from Irving Copas., MD sent at 04/27/2019  7:24 AM EDT ----- Regarding: EUS/EGD with EMR Dasie Chancellor,Please schedule the patient a telehealth visit in the next 3 to 4 weeks.  We will then look for an EGD/EUS with EMR 90-minute slot to be done after our visit for discussion of mucosal resection techniques if possible.Thank you.GM ----- Message ----- From: Carol Ada, MD Sent: 04/25/2019  12:35 PM EDT To: Irving Copas., MD  Chester Holstein,   I can't recall the exact details, but I do remember that I had a hard time with his EUS.  I could not get a confident look at the base.    A relook with the EUS and possible EMR will be helpful.  He is not symptomatic.  He happened to follow up with me as he had melenic stools from using Peptobismol.  That is when I found out that he never went to [facility]. ----- Message ----- From: Mansouraty, Telford Nab., MD Sent: 04/23/2019   4:56 PM EDT To: Carol Ada, MD  I looked at your EUS, do you think this is a submucosal process? I didn't think you had Layer 4 being involved. I'd be happy to relook with EUS and consider EMR. Is he having any issues - outlet obstructive symptoms? or just following up finally? Thanks. GM ----- Message ----- From: Carol Ada, MD Sent: 04/23/2019   2:38 PM EDT To: Irving Copas., MD  Gabe,   Can you look at this patient.  He has a large duodenal bulb polyp that was too large for me to comfortably remove.  The exact nature of the polyp is unknown.  He was referred to [facility] in January last year, but he was not able to go as he hurt his back.  Let me know what you think.  Thanks!  Saralyn Pilar

## 2019-04-28 NOTE — Telephone Encounter (Signed)
6/4 830 am appt with Dr Jerilynn Mages. Letter mailed to the pt

## 2019-05-01 DIAGNOSIS — M1712 Unilateral primary osteoarthritis, left knee: Secondary | ICD-10-CM | POA: Diagnosis not present

## 2019-05-20 ENCOUNTER — Telehealth: Payer: Self-pay | Admitting: Gastroenterology

## 2019-05-20 ENCOUNTER — Telehealth: Payer: Self-pay | Admitting: General Surgery

## 2019-05-20 ENCOUNTER — Ambulatory Visit (INDEPENDENT_AMBULATORY_CARE_PROVIDER_SITE_OTHER): Payer: Self-pay | Admitting: Family Medicine

## 2019-05-20 NOTE — Telephone Encounter (Signed)
Pt stated that he has been in contact with a colleague at work who has COVID-19.  He is scheduled for an appt 6/4 with Dr. Rush Landmark.

## 2019-05-20 NOTE — Telephone Encounter (Signed)
The pt has been advised that his appt will be a telehealth appt and will not be in the office. The pt has been advised of the information and verbalized understanding.

## 2019-05-20 NOTE — Telephone Encounter (Signed)
Patient is scheduled for a virtual visit on 05/22/2019. LVM for him to contact the office for a pre-visit screening.

## 2019-05-22 ENCOUNTER — Ambulatory Visit: Payer: BC Managed Care – PPO | Admitting: Gastroenterology

## 2019-05-22 ENCOUNTER — Other Ambulatory Visit: Payer: Self-pay

## 2019-06-25 DIAGNOSIS — G5621 Lesion of ulnar nerve, right upper limb: Secondary | ICD-10-CM | POA: Diagnosis not present

## 2019-06-25 DIAGNOSIS — M25521 Pain in right elbow: Secondary | ICD-10-CM | POA: Diagnosis not present

## 2019-07-07 DIAGNOSIS — M25521 Pain in right elbow: Secondary | ICD-10-CM | POA: Diagnosis not present

## 2019-07-07 DIAGNOSIS — G5621 Lesion of ulnar nerve, right upper limb: Secondary | ICD-10-CM | POA: Diagnosis not present

## 2019-07-14 DIAGNOSIS — G5621 Lesion of ulnar nerve, right upper limb: Secondary | ICD-10-CM | POA: Diagnosis not present

## 2019-07-14 DIAGNOSIS — M25521 Pain in right elbow: Secondary | ICD-10-CM | POA: Diagnosis not present

## 2019-07-25 DIAGNOSIS — Z0001 Encounter for general adult medical examination with abnormal findings: Secondary | ICD-10-CM | POA: Diagnosis not present

## 2019-07-25 DIAGNOSIS — Z125 Encounter for screening for malignant neoplasm of prostate: Secondary | ICD-10-CM | POA: Diagnosis not present

## 2019-07-25 DIAGNOSIS — Z1159 Encounter for screening for other viral diseases: Secondary | ICD-10-CM | POA: Diagnosis not present

## 2019-08-01 DIAGNOSIS — E559 Vitamin D deficiency, unspecified: Secondary | ICD-10-CM | POA: Diagnosis not present

## 2019-08-01 DIAGNOSIS — I10 Essential (primary) hypertension: Secondary | ICD-10-CM | POA: Diagnosis not present

## 2019-08-01 DIAGNOSIS — Z0001 Encounter for general adult medical examination with abnormal findings: Secondary | ICD-10-CM | POA: Diagnosis not present

## 2019-08-01 DIAGNOSIS — R7309 Other abnormal glucose: Secondary | ICD-10-CM | POA: Diagnosis not present

## 2019-09-17 DIAGNOSIS — R21 Rash and other nonspecific skin eruption: Secondary | ICD-10-CM | POA: Diagnosis not present

## 2019-09-17 DIAGNOSIS — Z23 Encounter for immunization: Secondary | ICD-10-CM | POA: Diagnosis not present

## 2019-10-06 DIAGNOSIS — T22212A Burn of second degree of left forearm, initial encounter: Secondary | ICD-10-CM | POA: Diagnosis not present

## 2019-10-08 DIAGNOSIS — T22212D Burn of second degree of left forearm, subsequent encounter: Secondary | ICD-10-CM | POA: Diagnosis not present

## 2019-10-08 DIAGNOSIS — M79602 Pain in left arm: Secondary | ICD-10-CM | POA: Diagnosis not present

## 2019-10-08 DIAGNOSIS — B372 Candidiasis of skin and nail: Secondary | ICD-10-CM | POA: Diagnosis not present

## 2019-10-17 DIAGNOSIS — M79602 Pain in left arm: Secondary | ICD-10-CM | POA: Diagnosis not present

## 2019-10-17 DIAGNOSIS — T22212D Burn of second degree of left forearm, subsequent encounter: Secondary | ICD-10-CM | POA: Diagnosis not present

## 2019-10-17 DIAGNOSIS — B372 Candidiasis of skin and nail: Secondary | ICD-10-CM | POA: Diagnosis not present

## 2020-01-28 DIAGNOSIS — R1033 Periumbilical pain: Secondary | ICD-10-CM | POA: Diagnosis not present

## 2020-02-25 DIAGNOSIS — K625 Hemorrhage of anus and rectum: Secondary | ICD-10-CM | POA: Diagnosis not present

## 2020-02-25 DIAGNOSIS — K5732 Diverticulitis of large intestine without perforation or abscess without bleeding: Secondary | ICD-10-CM | POA: Diagnosis not present

## 2020-03-12 DIAGNOSIS — Z6841 Body Mass Index (BMI) 40.0 and over, adult: Secondary | ICD-10-CM | POA: Diagnosis not present

## 2020-03-12 DIAGNOSIS — M1712 Unilateral primary osteoarthritis, left knee: Secondary | ICD-10-CM | POA: Diagnosis not present

## 2020-03-12 DIAGNOSIS — M25562 Pain in left knee: Secondary | ICD-10-CM | POA: Diagnosis not present

## 2020-03-19 DIAGNOSIS — M25562 Pain in left knee: Secondary | ICD-10-CM | POA: Diagnosis not present

## 2020-03-19 DIAGNOSIS — M1712 Unilateral primary osteoarthritis, left knee: Secondary | ICD-10-CM | POA: Diagnosis not present

## 2020-03-31 ENCOUNTER — Ambulatory Visit: Payer: BLUE CROSS/BLUE SHIELD | Admitting: Cardiology

## 2020-03-31 ENCOUNTER — Other Ambulatory Visit: Payer: Self-pay

## 2020-03-31 ENCOUNTER — Encounter: Payer: Self-pay | Admitting: Cardiology

## 2020-03-31 VITALS — BP 148/102 | HR 97 | Temp 98.8°F | Resp 18 | Ht 70.0 in | Wt 350.8 lb

## 2020-03-31 DIAGNOSIS — R0602 Shortness of breath: Secondary | ICD-10-CM | POA: Diagnosis not present

## 2020-03-31 DIAGNOSIS — Z9989 Dependence on other enabling machines and devices: Secondary | ICD-10-CM

## 2020-03-31 DIAGNOSIS — R0789 Other chest pain: Secondary | ICD-10-CM | POA: Diagnosis not present

## 2020-03-31 DIAGNOSIS — G4733 Obstructive sleep apnea (adult) (pediatric): Secondary | ICD-10-CM

## 2020-03-31 DIAGNOSIS — I10 Essential (primary) hypertension: Secondary | ICD-10-CM | POA: Diagnosis not present

## 2020-03-31 DIAGNOSIS — R002 Palpitations: Secondary | ICD-10-CM

## 2020-03-31 MED ORDER — CLONIDINE 0.1 MG/24HR TD PTWK: 0.1000 mg | MEDICATED_PATCH | TRANSDERMAL | 3 refills | Status: DC

## 2020-03-31 MED ORDER — METOPROLOL SUCCINATE ER 100 MG PO TB24: ORAL_TABLET | ORAL | 1 refills | Status: DC

## 2020-03-31 NOTE — Progress Notes (Signed)
Primary Physician/Referring:  Deland Pretty, MD  Patient ID: Leslie Soto, male    DOB: [birth year], 59 y.o.   MRN: [id number]  Chief Complaint  Patient presents with  . Palpitations  . Follow-up  . Shortness of Breath   HPI:    Leslie Soto  is a 59 y.o. male  with past medical history of morbid obesity, hypertension, atrial bigeminy noted on EKG during colonoscopy, obstructive sleep apnea on CPAP therapy and has had recurrent episodes of nephrolithiasis. He has also been noted to have right bundle branch block since January 2020.  He underwent nuclear perfusion study in June 2013 which revealed normal perfusion. Recent echocardiogram in April 2018 showed ejection fraction of 55-60% with normal wall motion and grade 1 diastolic dysfunction. No significant valvular abnormality.   He was seen a year ago by [place], he made an appointment to see [place] as he was having episodes of chest tightness with exertional activities, worsening dyspnea on exertion and also frequent episodes of skipped beats in his heart.  Denies leg edema, denies painful swelling of the lower extremity, no hemoptysis.  States that he is working overtime all the time.  He has been compliant with CPAP.  Past Medical History:  Diagnosis Date  . Abnormal EKG    hx right bundle branck block on ekg  . Asthma    cold weather induced  . Bronchial asthma 02/18/2019  . Degenerative disc disease    ?  . Gastric ulcer with hemorrhage 08/28/2012  . GI bleed due to NSAIDs   . History of kidney stones   . Hypertension   . Laboratory examination 02/18/2019  . Lower back pain   . Obesity (BMI 35.0-39.9 without comorbidity)   . OSA (obstructive sleep apnea)    Dr. Vertell Limber, [place]-uses cpap at night  set on 10-8  . Palpitations 02/18/2019  . Pneumonia 8 years ago   walking pneumonia   . Right bundle branch block (RBBB) 02/18/2019  . Shortness of breath 02/18/2019  . Spina bifida ([place])    L 2 or L 3 defect creating left leg pain     Past Surgical History:  Procedure Laterality Date  . CARDIOVASCULAR STRESS TEST  06/12/2012   Normal pattern of perfusion in all regions, no ECG changes, EKG negative for ischemia.  . COLONOSCOPY WITH PROPOFOL N/A 12/07/2017   Procedure: COLONOSCOPY WITH PROPOFOL;  Surgeon: Carol Ada, MD;  Location: [facility];  Service: Endoscopy;  Laterality: N/A;  . CYSTOSCOPY/URETEROSCOPY/HOLMIUM LASER/STENT PLACEMENT Bilateral 01/31/2017   Procedure: CYSTOSCOPY/BILATERAL RETROGRADE PYELOGRAM/ LEFT URETEROSCOPYLEFT /HOLMIUM LASER/BILATERAL STENT PLACEMENT;  Surgeon: Alexis Frock, MD;  Location: [facility];  Service: Urology;  Laterality: Bilateral;  . CYSTOSCOPY/URETEROSCOPY/HOLMIUM LASER/STENT PLACEMENT Bilateral 02/21/2017   Procedure: CYSTOSCOPY/URETEROSCOPY/HOLMIUM LASER/STENT REPLACEMENT BILATERAL RETROGRADE;  Surgeon: Alexis Frock, MD;  [facility]: [facility];  Service: Urology;  Laterality: Bilateral;  . ESOPHAGOGASTRODUODENOSCOPY  08/28/2012   Procedure: ESOPHAGOGASTRODUODENOSCOPY (EGD);  Surgeon: Inda Castle, MD;  Location: [facility];  Service: Endoscopy;  Laterality: N/A;  . ESOPHAGOGASTRODUODENOSCOPY (EGD) WITH PROPOFOL N/A 12/07/2017   Procedure: ESOPHAGOGASTRODUODENOSCOPY (EGD) WITH PROPOFOL;  Surgeon: Carol Ada, MD;  [facility];  Service: Endoscopy;  Laterality: N/A;  . EUS N/A 01/04/2018   Procedure: UPPER ENDOSCOPIC ULTRASOUND (EUS) LINEAR;  Surgeon: Carol Ada, MD;  [facility];  Service: Endoscopy;  Laterality: N/A;  . EXTRACORPOREAL SHOCK WAVE LITHOTRIPSY     2003  . EYE SURGERY Bilateral    1969 weak eye muscle  . FINE  NEEDLE ASPIRATION N/A 01/04/2018   Procedure: FINE NEEDLE ASPIRATION (FNA) LINEAR;  Surgeon: Carol Ada, MD;  [facility];  Service: Endoscopy;  Laterality: N/A;  . HERNIA REPAIR  09/20/1983   87, 86  . HERNIA REPAIR  09/25/1997  . KNEE ARTHROSCOPY Left 01/20/2011  . TRANSTHORACIC ECHOCARDIOGRAM  04/03/2013   EF 55-60%,  moderate concentric hypertrophy,    Family History  Problem Relation Age of Onset  . Mental illness Father   . Heart disease Father   . Diabetes Father   . Hypertension Father   . Stroke Mother   . Hypertension Mother   . Hypertension Brother     Social History   Tobacco Use  . Smoking status: Never Smoker  . Smokeless tobacco: Never Used  Substance Use Topics  . Alcohol use: No   ROS  Review of Systems  Constitution: Positive for malaise/fatigue.  Cardiovascular: Positive for chest pain, dyspnea on exertion and palpitations. Negative for leg swelling.  Musculoskeletal: Positive for back pain.  Gastrointestinal: Negative for melena.   Objective  Blood pressure (!) 148/102, pulse 97, temperature 98.8 F (37.1 C), resp. rate 18, height 5\' 10"  (1.778 m), weight (!) 350 lb 12.8 oz (159.1 kg), SpO2 96 %.  Vitals with BMI 03/31/2020 03/31/2020 02/19/2019  Height - 5\' 10"  -  Weight - 350 lbs 13 oz -  BMI - 123XX123 -  Systolic 123456 A999333 123456  Diastolic A999333 123XX123 96  Pulse 97 99 63     Physical Exam  Constitutional:  Morbidly obese in no acute distress.  Cardiovascular: Normal rate, regular rhythm and normal heart sounds. Exam reveals no gallop.  No murmur heard. Pulses:      Carotid pulses are 2+ on the right side and 2+ on the left side.      Dorsalis pedis pulses are 2+ on the right side and 2+ on the left side.       Posterior tibial pulses are 2+ on the right side and 2+ on the left side.  Femoral and popliteal pulse difficult to feel due to patient's body habitus.  No leg edema. JVD difficult to see due to short neck.  Pulmonary/Chest: Effort normal and breath sounds normal.  Abdominal: Soft. Bowel sounds are normal.  Obese. Pannus present   Laboratory examination:   No results for input(s): NA, K, CL, CO2, GLUCOSE, Soto, CREATININE, CALCIUM, GFRNONAA, GFRAA in the last 8760 hours. CrCl cannot be calculated (Patient's most recent lab result is older than the maximum 21 days  allowed.).  CMP Latest Ref Rng & Units 02/21/2017 01/30/2017 08/26/2012  Glucose 65 - 99 mg/dL 101(H) 111(H) 111(H)  Soto 6 - 20 mg/dL 17 15 36(H)  Creatinine 0.61 - 1.24 mg/dL 0.97 0.74 0.77  Sodium 135 - 145 mmol/L 138 139 141  Potassium 3.5 - 5.1 mmol/L 4.1 3.9 4.1  Chloride 101 - 111 mmol/L 108 108 109  CO2 22 - 32 mmol/L 22 21(L) 22  Calcium 8.9 - 10.3 mg/dL 8.9 9.6 9.8  Total Protein 6.0 - 8.3 g/dL - - 6.6  Total Bilirubin 0.3 - 1.2 mg/dL - - 0.7  Alkaline Phos 39 - 117 U/L - - 74  AST 0 - 37 U/L - - 13  ALT 0 - 53 U/L - - 19   CBC Latest Ref Rng & Units 02/21/2017 01/30/2017 08/28/2012  WBC 4.0 - 10.5 K/uL 12.4(H) 11.4(H) 12.2(H)  Hemoglobin 13.0 - 17.0 g/dL 15.0 16.9 10.5(L)  Hematocrit 39.0 - 52.0 %  43.3 47.8 30.7(L)  Platelets 150 - 400 K/uL 221 207 192   Lipid Panel  No results found for: CHOL, TRIG, HDL, CHOLHDL, VLDL, LDLCALC, LDLDIRECT HEMOGLOBIN A1C No results found for: HGBA1C, MPG TSH No results for input(s): TSH in the last 8760 hours.  External labs:   Not available  Medications and allergies  No Known Allergies   Current Outpatient Medications  Medication Instructions  . acetaminophen (TYLENOL) 500 mg, Oral, 2 times daily  . amLODipine (NORVASC) 10 mg, Oral, Daily at bedtime  . Cinnamon Bark POWD Does not apply  . cloNIDine (CATAPRES - DOSED IN MG/24 HR) 0.1 mg, Transdermal, Weekly  . Ginger 500 MG CAPS Oral, 2 times daily  . Magnesium 500 MG CAPS 1 capsule, Oral, Daily  . metoprolol succinate (TOPROL XL) 100 MG 24 hr tablet One tablet daily with food  . Turmeric (QC TUMERIC COMPLEX PO) Oral  . valsartan-hydrochlorothiazide (DIOVAN-HCT) 320-12.5 MG tablet 1 tablet, Oral, Daily   Radiology:   No results found.  Cardiac Studies:   Sleep Study [2012]: sleep apnea  External Pharmacological Stress Test 06/12/2012: ECG Conclusions: Within normal limits. Normal pattern of perfusion in all myocardial regions.  Echocardiogram 04/04/2017: Left ventricle: The  cavity size was normal. There was mild to moderate concentric hypertrophy. Systolic function was normal. The estimated ejection fraction was in the range of 55% to 60%. Wall motion was normal; there were no regional wall motion abnormalities. Doppler parameters are consistent with abnormal left ventricular relaxation (grade 1 diastolic dysfunction). Doppler parameters are consistent with indeterminate ventricular filling pressure.  No significant valvular abnormality.   EKG  EKG 03/31/2020: Normal sinus rhythm with rate of 95 bpm, left axis deviation, left intrafascicular block.  Right bundle branch block.  No evidence of ischemia. No significant change from EKG 02/19/2019.  Assessment     ICD-10-CM   1. Atypical chest pain  R07.89 PCV ECHOCARDIOGRAM COMPLETE    PCV MYOCARDIAL PERFUSION WITH LEXISCAN  2. Shortness of breath  R06.02 PCV ECHOCARDIOGRAM COMPLETE    PCV MYOCARDIAL PERFUSION WITH LEXISCAN  3. Palpitations  R00.2 EKG 12-Lead    metoprolol succinate (TOPROL XL) 100 MG 24 hr tablet  4. Essential hypertension  I10 metoprolol succinate (TOPROL XL) 100 MG 24 hr tablet    cloNIDine (CATAPRES - DOSED IN MG/24 HR) 0.1 mg/24hr patch  5. Morbidly obese (HCC)  E66.01   6. OSA on CPAP  G47.33    Z99.89     Meds ordered this encounter  Medications  . metoprolol succinate (TOPROL XL) 100 MG 24 hr tablet    Sig: One tablet daily with food    Dispense:  90 tablet    Refill:  1  . cloNIDine (CATAPRES - DOSED IN MG/24 HR) 0.1 mg/24hr patch    Sig: Place 1 patch (0.1 mg total) onto the skin once a week.    Dispense:  4 patch    Refill:  3    Medications Discontinued During This Encounter  Medication Reason  . GLUCOSAMINE-CHONDROITIN PO Discontinued by provider  . metoprolol succinate (TOPROL XL) 50 MG 24 hr tablet     Recommendations:   Leslie Soto  is a 59 y.o. male  with past medical history of morbid obesity, hypertension, atrial bigeminy noted on EKG during colonoscopy,  obstructive sleep apnea on CPAP therapy and has had recurrent episodes of nephrolithiasis. He has also been noted to have right bundle branch block since January 2020.  He is now being seen  for evaluation of chest pain, palpitations and worsening dyspnea on exertion.  Will increase metoprolol succinate from 25 mg daily to 1/2 mg daily for palpitations and hypertension, I have also added clonidine patch 0.1 mg q. 1 week.  Chest pain symptoms appear to be at most atypical but extremely difficult to delineate due to morbid obesity, reduced physical activity, does have cardiac risk factors including hypertension and morbid obesity and age. Schedule for a Lexiscan Sestamibi stress test to evaluate for myocardial ischemia. Patient unable to do treadmill stress testing due to arthritis and dyspnea. Will schedule for an echocardiogram.  I will see him back in 6 weeks for follow-up.  I requested labs from PCP.  Adrian Prows, MD, [facility] 03/31/2020, 4:10 PM [facility]. [place] Office: [phone]

## 2020-04-09 DIAGNOSIS — M25562 Pain in left knee: Secondary | ICD-10-CM | POA: Diagnosis not present

## 2020-04-09 DIAGNOSIS — M1712 Unilateral primary osteoarthritis, left knee: Secondary | ICD-10-CM | POA: Diagnosis not present

## 2020-04-12 ENCOUNTER — Other Ambulatory Visit: Payer: Self-pay

## 2020-04-12 ENCOUNTER — Ambulatory Visit: Payer: BC Managed Care – PPO

## 2020-04-12 DIAGNOSIS — R0602 Shortness of breath: Secondary | ICD-10-CM

## 2020-04-12 DIAGNOSIS — R0789 Other chest pain: Secondary | ICD-10-CM | POA: Diagnosis not present

## 2020-04-20 ENCOUNTER — Other Ambulatory Visit: Payer: Self-pay

## 2020-04-20 ENCOUNTER — Ambulatory Visit: Payer: BC Managed Care – PPO

## 2020-04-20 DIAGNOSIS — R0602 Shortness of breath: Secondary | ICD-10-CM

## 2020-04-20 DIAGNOSIS — R0789 Other chest pain: Secondary | ICD-10-CM

## 2020-04-25 NOTE — Progress Notes (Signed)
Normal heart function. Mild diastolic dysfunction.  Diastolic function means the heart has to relax to receive the blood so it can pump the blood out. If relaxation is impaired, then the blood coming to the heart is restricted and can cause dyspnea and reduced functional capacity and fluid build up. Exercise, weight loss, control of BP, salt restriction will improve this.

## 2020-04-26 NOTE — Progress Notes (Signed)
Called patient , NA, LMAM to give [place] a call back for Echo results.

## 2020-04-27 NOTE — Progress Notes (Signed)
Patient called back, I have discussed echo results with him.

## 2020-05-12 NOTE — Progress Notes (Signed)
Primary Physician/Referring:  Deland Pretty, MD  Patient ID: Leslie Soto, male    DOB: 1961/10/10, 59 y.o.   MRN: [id number]  Chief Complaint  Patient presents with  . Hypertension  . Chest Pain  . Shortness of Breath  . Follow-up    6 week  . Results    echo and stress    HPI:    Leslie Soto  is a 59 y.o. male  with past medical history of morbid obesity, hypertension, atrial bigeminy noted on EKG during colonoscopy, obstructive sleep apnea on CPAP therapy and has had recurrent episodes of nephrolithiasis. He has also been noted to have right bundle branch block since January 2020.  He presents here for follow-up of dyspnea and hypertension.  He has made significant lifestyle changes and has lost about 5 to 6 pounds in weight over the past 4 weeks. He has been compliant with CPAP.  Past Medical History:  Diagnosis Date  . Abnormal EKG    hx right bundle branck block on ekg  . Asthma    cold weather induced  . Bronchial asthma 02/18/2019  . Degenerative disc disease    ?  . Gastric ulcer with hemorrhage 08/28/2012  . GI bleed due to NSAIDs   . History of kidney stones   . Hypertension   . Laboratory examination 02/18/2019  . Lower back pain   . Obesity (BMI 35.0-39.9 without comorbidity)   . OSA (obstructive sleep apnea)    Dr. Vertell Limber, [place]-uses cpap at night  set on 10-8  . Palpitations 02/18/2019  . Pneumonia 8 years ago   walking pneumonia   . Right bundle branch block (RBBB) 02/18/2019  . Shortness of breath 02/18/2019  . Spina bifida ([place])    L 2 or L 3 defect creating left leg pain    Past Surgical History:  Procedure Laterality Date  . CARDIOVASCULAR STRESS TEST  06/12/2012   Normal pattern of perfusion in all regions, no ECG changes, EKG negative for ischemia.  . COLONOSCOPY WITH PROPOFOL N/A 12/07/2017   Procedure: COLONOSCOPY WITH PROPOFOL;  Surgeon: Carol Ada, MD;  [facility];  Service: Endoscopy;  Laterality: N/A;  .  CYSTOSCOPY/URETEROSCOPY/HOLMIUM LASER/STENT PLACEMENT Bilateral 01/31/2017   Procedure: CYSTOSCOPY/BILATERAL RETROGRADE PYELOGRAM/ LEFT URETEROSCOPYLEFT /HOLMIUM LASER/BILATERAL STENT PLACEMENT;  Surgeon: Alexis Frock, MD;  [facility];  Service: Urology;  Laterality: Bilateral;  . CYSTOSCOPY/URETEROSCOPY/HOLMIUM LASER/STENT PLACEMENT Bilateral 02/21/2017   Procedure: CYSTOSCOPY/URETEROSCOPY/HOLMIUM LASER/STENT REPLACEMENT BILATERAL RETROGRADE;  Surgeon: Alexis Frock, MD;  [facility];  Service: Urology;  Laterality: Bilateral;  . ESOPHAGOGASTRODUODENOSCOPY  08/28/2012   Procedure: ESOPHAGOGASTRODUODENOSCOPY (EGD);  Surgeon: Inda Castle, MD;  Location: [employer];  Service: Endoscopy;  Laterality: N/A;  . ESOPHAGOGASTRODUODENOSCOPY (EGD) WITH PROPOFOL N/A 12/07/2017   Procedure: ESOPHAGOGASTRODUODENOSCOPY (EGD) WITH PROPOFOL;  Surgeon: Carol Ada, MD;  [facility];  Service: Endoscopy;  Laterality: N/A;  . EUS N/A 01/04/2018   Procedure: UPPER ENDOSCOPIC ULTRASOUND (EUS) LINEAR;  Surgeon: Carol Ada, MD;  [facility];  Service: Endoscopy;  Laterality: N/A;  . EXTRACORPOREAL SHOCK WAVE LITHOTRIPSY     2003  . EYE SURGERY Bilateral    1969 weak eye muscle  . FINE NEEDLE ASPIRATION N/A 01/04/2018   Procedure: FINE NEEDLE ASPIRATION (FNA) LINEAR;  Surgeon: Carol Ada, MD;  [facility];  Service: Endoscopy;  Laterality: N/A;  . HERNIA REPAIR  09/20/1983   87, 86  . HERNIA REPAIR  09/25/1997  . KNEE ARTHROSCOPY Left 01/20/2011  .  TRANSTHORACIC ECHOCARDIOGRAM  04/03/2013   EF 55-60%, moderate concentric hypertrophy,    Family History  Problem Relation Age of Onset  . Mental illness Father   . Heart disease Father   . Diabetes Father   . Hypertension Father   . Stroke Mother   . Hypertension Mother   . Hypertension Brother     Social History   Tobacco Use  . Smoking status: Never Smoker  . Smokeless tobacco: Never Used  Substance  Use Topics  . Alcohol use: No   Marital Status: Widowed ROS  Review of Systems  Constitution: Positive for malaise/fatigue.  Cardiovascular: Positive for chest pain, dyspnea on exertion and palpitations. Negative for leg swelling.  Musculoskeletal: Positive for back pain.  Gastrointestinal: Negative for melena.   Objective  Blood pressure (!) 149/95, pulse 68, resp. rate 17, height 5\' 10"  (1.778 m), weight (!) 349 lb (158.3 kg), SpO2 97 %.  Vitals with BMI 05/13/2020 03/31/2020 03/31/2020  Height 5\' 10"  - 5\' 10"   Weight 349 lbs - 350 lbs 13 oz  BMI 123456 - 123XX123  Systolic 123456 123456 A999333  Diastolic 95 A999333 123XX123  Pulse 68 97 99     Physical Exam  Constitutional:  Morbidly obese in no acute distress.  Cardiovascular: Normal rate, regular rhythm and normal heart sounds. Exam reveals no gallop.  No murmur heard. Pulses:      Carotid pulses are 2+ on the right side and 2+ on the left side.      Dorsalis pedis pulses are 2+ on the right side and 2+ on the left side.       Posterior tibial pulses are 2+ on the right side and 2+ on the left side.  Femoral and popliteal pulse difficult to feel due to patient's body habitus.  No leg edema. JVD difficult to see due to short neck.  Pulmonary/Chest: Effort normal and breath sounds normal.  Abdominal: Soft. Bowel sounds are normal.  Obese. Pannus present   Laboratory examination:   No results for input(s): NA, K, CL, CO2, GLUCOSE, Soto, CREATININE, CALCIUM, GFRNONAA, GFRAA in the last 8760 hours. CrCl cannot be calculated (Patient's most recent lab result is older than the maximum 21 days allowed.).  CMP Latest Ref Rng & Units 02/21/2017 01/30/2017 08/26/2012  Glucose 65 - 99 mg/dL 101(H) 111(H) 111(H)  Soto 6 - 20 mg/dL 17 15 36(H)  Creatinine 0.61 - 1.24 mg/dL 0.97 0.74 0.77  Sodium 135 - 145 mmol/L 138 139 141  Potassium 3.5 - 5.1 mmol/L 4.1 3.9 4.1  Chloride 101 - 111 mmol/L 108 108 109  CO2 22 - 32 mmol/L 22 21(L) 22  Calcium 8.9 - 10.3 mg/dL  8.9 9.6 9.8  Total Protein 6.0 - 8.3 g/dL - - 6.6  Total Bilirubin 0.3 - 1.2 mg/dL - - 0.7  Alkaline Phos 39 - 117 U/L - - 74  AST 0 - 37 U/L - - 13  ALT 0 - 53 U/L - - 19   CBC Latest Ref Rng & Units 02/21/2017 01/30/2017 08/28/2012  WBC 4.0 - 10.5 K/uL 12.4(H) 11.4(H) 12.2(H)  Hemoglobin 13.0 - 17.0 g/dL 15.0 16.9 10.5(L)  Hematocrit 39.0 - 52.0 % 43.3 47.8 30.7(L)  Platelets 150 - 400 K/uL 221 207 192   Lipid Panel  No results found for: CHOL, TRIG, HDL, CHOLHDL, VLDL, LDLCALC, LDLDIRECT HEMOGLOBIN A1C No results found for: HGBA1C, MPG TSH No results for input(s): TSH in the last 8760 hours.  External labs:  Lipid Panel  completed 07/25/2019 Cholesterol, total 154.000 01/25/2016 Triglycerides 122.000 07/25/2019 HDL 29.000 07/25/2019 LDL-C 93.000 M 01/25/2016  PSA 1.580 07/25/2019  Glucose Random 0.000 05/04/2017 Soto 14.000 03/15/2017 Creatinine, Serum 0.970 02/21/2017  TSH 1.240 02/28/2017  A1C 5.700 % 01/25/2016   Medications and allergies  No Known Allergies   Current Outpatient Medications  Medication Instructions  . acetaminophen (TYLENOL) 500 mg, Oral, 2 times daily  . amLODipine (NORVASC) 10 mg, Oral, Daily at bedtime  . Cinnamon Bark POWD Does not apply  . Ginger 500 MG CAPS Oral, 2 times daily  . Magnesium 500 MG CAPS 1 capsule, Oral, Daily  . metoprolol succinate (TOPROL XL) 100 MG 24 hr tablet One tablet daily with food  . spironolactone (ALDACTONE) 25 mg, Oral, BH-each morning  . Turmeric (QC TUMERIC COMPLEX PO) Oral  . valsartan-hydrochlorothiazide (DIOVAN-HCT) 320-12.5 MG tablet 1 tablet, Oral, Daily   Radiology:   No results found.  Cardiac Studies:   Sleep Study 2012: Sleep apnea.  External Pharmacological Stress Test 06/12/2012: ECG Conclusions: Within normal limits. Normal pattern of perfusion in all myocardial regions.  Lexiscan (Walking with Jaci Carrel) Sestamibi Stress Test 04/12/2020: Lexiscan stress. Patient achieved THR with mod Bruce protocol. No ST-T  changes of ischemia. The heart rate response was accelerated. The baseline blood pressure was 198/110 mmHg. Maximum blood pressure post injection was 238/118 mmHg. Hypertensive throughout.  Myocardial perfusion is normal. Overall LV systolic function is abnormal without regional wall motion abnormalities. Stress LV EF: 44%.  Findings suggest non ischemic cardiomyopathy.  No previous exam available for comparison.  Intermediate risk.  Echocardiogram 04/20/2020:  Normal LV systolic function with visual EF 50-55%. Left ventricle cavity is normal in size. Mild to Moderate left ventricular hypertrophy. Normal  global wall motion. Indeterminate diastolic filling pattern, elevated LAP.  Moderate LA enlargement at 4.7 cm. Mild (Grade I) mitral regurgitation.  Mild tricuspid regurgitation.  The aortic root is mildly dilated at 3.8 cm. Proximal ascending aorta not well visualized.  Compared to prior study dated 04/04/2017 Mild MR and TR are new, aortic dilatation is new.   EKG  03/31/2020: Normal sinus rhythm with rate of 95 bpm, left axis deviation, left intrafascicular block.  Right bundle branch block.  No evidence of ischemia. No significant change from EKG 02/19/2019.  Assessment     ICD-10-CM   1. Essential hypertension  I10 spironolactone (ALDACTONE) 25 MG tablet  2. Shortness of breath  R06.02   3. Morbidly obese (HCC)  E66.01     Meds ordered this encounter  Medications  . spironolactone (ALDACTONE) 25 MG tablet    Sig: Take 1 tablet (25 mg total) by mouth every morning.    Dispense:  30 tablet    Refill:  2    Medications Discontinued During This Encounter  Medication Reason  . cloNIDine (CATAPRES - DOSED IN MG/24 HR) 0.1 mg/24hr patch Discontinued by provider    Recommendations:   Leslie Soto  is a 59 y.o. male  with past medical history of morbid obesity, hypertension, atrial bigeminy noted on EKG during colonoscopy, obstructive sleep apnea on CPAP therapy and has had  recurrent episodes of nephrolithiasis. He has also been noted to have right bundle branch block since January 2020.  He is now being seen for evaluation of chest pain, palpitations and worsening dyspnea on exertion.  He presents here for follow-up of dyspnea and hypertension.  He has made significant lifestyle changes and has lost about 5 to 6 pounds in weight over the  past 4 weeks.  Blood pressure is improved but still not controlled.  He could not tolerate clonidine patch.  I will add Aldactone 25 mg in the morning, advised him to watch out for high potassium foods.  He has been scheduled for complete physical examination and labs on 05/24/2020, with Dr. Deland Pretty, I will request him to follow-up on BMP and also consider 90-day supply of spironolactone if appropriate.  I will see the patient back in 6 months for follow-up. He has been compliant with CPAP.  Adrian Prows, MD, [facility] 05/13/2020, 10:53 AM [facility] [place] Pager: [phone] Office: [phone]

## 2020-05-13 ENCOUNTER — Encounter: Payer: Self-pay | Admitting: Cardiology

## 2020-05-13 ENCOUNTER — Ambulatory Visit: Payer: BC Managed Care – PPO | Admitting: Cardiology

## 2020-05-13 ENCOUNTER — Other Ambulatory Visit: Payer: Self-pay

## 2020-05-13 VITALS — BP 149/95 | HR 68 | Resp 17 | Ht 70.0 in | Wt 349.0 lb

## 2020-05-13 DIAGNOSIS — I10 Essential (primary) hypertension: Secondary | ICD-10-CM

## 2020-05-13 DIAGNOSIS — R0602 Shortness of breath: Secondary | ICD-10-CM

## 2020-05-13 MED ORDER — SPIRONOLACTONE 25 MG PO TABS: 25.0000 mg | ORAL_TABLET | ORAL | 2 refills | Status: DC

## 2020-05-24 DIAGNOSIS — I10 Essential (primary) hypertension: Secondary | ICD-10-CM | POA: Diagnosis not present

## 2020-06-07 DIAGNOSIS — Z79899 Other long term (current) drug therapy: Secondary | ICD-10-CM | POA: Diagnosis not present

## 2020-06-07 DIAGNOSIS — I10 Essential (primary) hypertension: Secondary | ICD-10-CM | POA: Diagnosis not present

## 2020-08-02 DIAGNOSIS — J019 Acute sinusitis, unspecified: Secondary | ICD-10-CM | POA: Diagnosis not present

## 2020-08-02 DIAGNOSIS — R509 Fever, unspecified: Secondary | ICD-10-CM | POA: Diagnosis not present

## 2020-08-02 DIAGNOSIS — R05 Cough: Secondary | ICD-10-CM | POA: Diagnosis not present

## 2020-08-02 DIAGNOSIS — Z03818 Encounter for observation for suspected exposure to other biological agents ruled out: Secondary | ICD-10-CM | POA: Diagnosis not present

## 2020-08-02 DIAGNOSIS — J209 Acute bronchitis, unspecified: Secondary | ICD-10-CM | POA: Diagnosis not present

## 2020-09-14 ENCOUNTER — Other Ambulatory Visit: Payer: Self-pay | Admitting: Cardiology

## 2020-09-14 DIAGNOSIS — R002 Palpitations: Secondary | ICD-10-CM

## 2020-09-14 DIAGNOSIS — I10 Essential (primary) hypertension: Secondary | ICD-10-CM

## 2020-09-30 DIAGNOSIS — M25561 Pain in right knee: Secondary | ICD-10-CM | POA: Diagnosis not present

## 2020-10-07 DIAGNOSIS — M1711 Unilateral primary osteoarthritis, right knee: Secondary | ICD-10-CM | POA: Diagnosis not present

## 2020-10-07 DIAGNOSIS — M5136 Other intervertebral disc degeneration, lumbar region: Secondary | ICD-10-CM | POA: Diagnosis not present

## 2020-10-18 DIAGNOSIS — M5136 Other intervertebral disc degeneration, lumbar region: Secondary | ICD-10-CM | POA: Diagnosis not present

## 2020-10-18 DIAGNOSIS — M545 Low back pain, unspecified: Secondary | ICD-10-CM | POA: Diagnosis not present

## 2020-10-18 DIAGNOSIS — M546 Pain in thoracic spine: Secondary | ICD-10-CM | POA: Diagnosis not present

## 2020-11-15 ENCOUNTER — Other Ambulatory Visit: Payer: Self-pay

## 2020-11-15 ENCOUNTER — Ambulatory Visit: Payer: BC Managed Care – PPO | Admitting: Cardiology

## 2020-11-15 ENCOUNTER — Encounter: Payer: Self-pay | Admitting: Cardiology

## 2020-11-15 VITALS — BP 175/107 | HR 71 | Resp 16 | Ht 70.0 in | Wt 356.0 lb

## 2020-11-15 DIAGNOSIS — R002 Palpitations: Secondary | ICD-10-CM

## 2020-11-15 DIAGNOSIS — I10 Essential (primary) hypertension: Secondary | ICD-10-CM

## 2020-11-15 DIAGNOSIS — R0602 Shortness of breath: Secondary | ICD-10-CM

## 2020-11-15 DIAGNOSIS — Z6841 Body Mass Index (BMI) 40.0 and over, adult: Secondary | ICD-10-CM

## 2020-11-15 MED ORDER — METOPROLOL SUCCINATE ER 200 MG PO TB24: ORAL_TABLET | ORAL | 3 refills | Status: DC

## 2020-11-15 MED ORDER — SPIRONOLACTONE 25 MG PO TABS: 25.0000 mg | ORAL_TABLET | ORAL | 3 refills | Status: DC

## 2020-11-15 NOTE — Progress Notes (Signed)
Primary Physician/Referring:  Deland Pretty, MD  Patient ID: Leslie Soto, male    DOB: 01/21/1961, 59 y.o.   MRN: [id number]  Chief Complaint  Patient presents with  . Hypertension  . Shortness of Breath  . Follow-up    6 month   HPI:    Leslie Soto  is a 59 y.o. male  with past medical history of morbid obesity, hypertension, atrial bigeminy noted on EKG during colonoscopy, obstructive sleep apnea on CPAP therapy and has had recurrent episodes of nephrolithiasis. He has also been noted to have right bundle branch block since January 2020.  He presents here for follow-up of dyspnea and hypertension, this is a 38-month office visit.  6 months ago with weight loss and increasing his physical activity, his blood pressure was controlled and his dyspnea had improved.  States that he has gone back to his old ways and his work has been extremely stressful and has been working for [employer] to 12 hours a day and feels he is unable to rest.  He has also noticed his blood pressure to be elevated.  Feels dizzy and has occasional headaches when he notices his blood pressure to be elevated.  He has not had any neurologic deficits, no syncope, no chest pain. He has been compliant with CPAP.  Past Medical History:  Diagnosis Date  . Abnormal EKG    hx right bundle branck block on ekg  . Asthma    cold weather induced  . Bronchial asthma 02/18/2019  . Degenerative disc disease    ?  . Gastric ulcer with hemorrhage 08/28/2012  . GI bleed due to NSAIDs   . History of kidney stones   . Hypertension   . Laboratory examination 02/18/2019  . Lower back pain   . Obesity (BMI 35.0-39.9 without comorbidity)   . OSA (obstructive sleep apnea)    Dr. Vertell Limber, [place]-uses cpap at night  set on 10-8  . Palpitations 02/18/2019  . Pneumonia 8 years ago   walking pneumonia   . Right bundle branch block (RBBB) 02/18/2019  . Shortness of breath 02/18/2019  . Spina bifida ([place])    L 2 or L 3 defect creating left leg  pain    Past Surgical History:  Procedure Laterality Date  . CARDIOVASCULAR STRESS TEST  06/12/2012   Normal pattern of perfusion in all regions, no ECG changes, EKG negative for ischemia.  . COLONOSCOPY WITH PROPOFOL N/A 12/07/2017   Procedure: COLONOSCOPY WITH PROPOFOL;  Surgeon: Carol Ada, MD;  Location: [facility];  Service: Endoscopy;  Laterality: N/A;  . CYSTOSCOPY/URETEROSCOPY/HOLMIUM LASER/STENT PLACEMENT Bilateral 01/31/2017   Procedure: CYSTOSCOPY/BILATERAL RETROGRADE PYELOGRAM/ LEFT URETEROSCOPYLEFT /HOLMIUM LASER/BILATERAL STENT PLACEMENT;  Surgeon: Alexis Frock, MD;  Location: [facility];  Service: Urology;  Laterality: Bilateral;  . CYSTOSCOPY/URETEROSCOPY/HOLMIUM LASER/STENT PLACEMENT Bilateral 02/21/2017   Procedure: CYSTOSCOPY/URETEROSCOPY/HOLMIUM LASER/STENT REPLACEMENT BILATERAL RETROGRADE;  Surgeon: Alexis Frock, MD;  Location: [facility];  Service: Urology;  Laterality: Bilateral;  . ESOPHAGOGASTRODUODENOSCOPY  08/28/2012   Procedure: ESOPHAGOGASTRODUODENOSCOPY (EGD);  Surgeon: Inda Castle, MD;  Location: [place];  Service: Endoscopy;  Laterality: N/A;  . ESOPHAGOGASTRODUODENOSCOPY (EGD) WITH PROPOFOL N/A 12/07/2017   Procedure: ESOPHAGOGASTRODUODENOSCOPY (EGD) WITH PROPOFOL;  Surgeon: Carol Ada, MD;  Location: [facility];  Service: Endoscopy;  Laterality: N/A;  . EUS N/A 01/04/2018   Procedure: UPPER ENDOSCOPIC ULTRASOUND (EUS) LINEAR;  Surgeon: Carol Ada, MD;  Location: [facility];  Service: Endoscopy;  Laterality: N/A;  . EXTRACORPOREAL SHOCK WAVE LITHOTRIPSY  2003  . EYE SURGERY Bilateral    1969 weak eye muscle  . FINE NEEDLE ASPIRATION N/A 01/04/2018   Procedure: FINE NEEDLE ASPIRATION (FNA) LINEAR;  Surgeon: Carol Ada, MD;  Location: [facility];  Service: Endoscopy;  Laterality: N/A;  . HERNIA REPAIR  09/20/1983   87, 84  . HERNIA REPAIR  09/25/1997  . KNEE ARTHROSCOPY Left 01/20/2011  . TRANSTHORACIC ECHOCARDIOGRAM  04/03/2013   EF  55-60%, moderate concentric hypertrophy,    Family History  Problem Relation Age of Onset  . Mental illness Father   . Heart disease Father   . Diabetes Father   . Hypertension Father   . Stroke Mother   . Hypertension Mother   . Hypertension Brother     Social History   Tobacco Use  . Smoking status: Never Smoker  . Smokeless tobacco: Never Used  Substance Use Topics  . Alcohol use: No   Marital Status: Widowed ROS  Review of Systems  Constitutional: Positive for malaise/fatigue.  Cardiovascular: Positive for chest pain, dyspnea on exertion and palpitations. Negative for leg swelling.  Musculoskeletal: Positive for back pain and joint pain.  Gastrointestinal: Negative for melena.   Objective  Blood pressure (!) 175/107, pulse 71, resp. rate 16, height 5\' 10"  (1.778 m), weight (!) 356 lb (161.5 kg), SpO2 97 %.  Vitals with BMI 11/15/2020 11/15/2020 05/13/2020  Height - 5\' 10"  5\' 10"   Weight - 356 lbs 349 lbs  BMI - 28.78 67.67  Systolic 209 470 962  Diastolic 836 629 95  Pulse 71 71 68     Physical Exam Constitutional:      Comments: Morbidly obese in no acute distress.  Cardiovascular:     Rate and Rhythm: Normal rate and regular rhythm.     Pulses:          Carotid pulses are 2+ on the right side and 2+ on the left side.      Dorsalis pedis pulses are 2+ on the right side and 2+ on the left side.       Posterior tibial pulses are 2+ on the right side and 2+ on the left side.     Heart sounds: Normal heart sounds. No murmur heard.  No gallop.      Comments: Femoral and popliteal pulse difficult to feel due to patient's body habitus.  No leg edema. JVD difficult to see due to short neck. Pulmonary:     Effort: Pulmonary effort is normal.     Breath sounds: Normal breath sounds.  Abdominal:     General: Bowel sounds are normal.     Palpations: Abdomen is soft.     Comments: Obese. Pannus present    Laboratory examination:   No results for input(s): NA, K,  CL, CO2, GLUCOSE, Soto, CREATININE, CALCIUM, GFRNONAA, GFRAA in the last 8760 hours. CrCl cannot be calculated (Patient's most recent lab result is older than the maximum 21 days allowed.).  CMP Latest Ref Rng & Units 02/21/2017 01/30/2017 08/26/2012  Glucose 65 - 99 mg/dL 101(H) 111(H) 111(H)  Soto 6 - 20 mg/dL 17 15 36(H)  Creatinine 0.61 - 1.24 mg/dL 0.97 0.74 0.77  Sodium 135 - 145 mmol/L 138 139 141  Potassium 3.5 - 5.1 mmol/L 4.1 3.9 4.1  Chloride 101 - 111 mmol/L 108 108 109  CO2 22 - 32 mmol/L 22 21(L) 22  Calcium 8.9 - 10.3 mg/dL 8.9 9.6 9.8  Total Protein 6.0 - 8.3 g/dL - - 6.6  Total  Bilirubin 0.3 - 1.2 mg/dL - - 0.7  Alkaline Phos 39 - 117 U/L - - 74  AST 0 - 37 U/L - - 13  ALT 0 - 53 U/L - - 19   CBC Latest Ref Rng & Units 02/21/2017 01/30/2017 08/28/2012  WBC 4.0 - 10.5 K/uL 12.4(H) 11.4(H) 12.2(H)  Hemoglobin 13.0 - 17.0 g/dL 15.0 16.9 10.5(L)  Hematocrit 39 - 52 % 43.3 47.8 30.7(L)  Platelets 150 - 400 K/uL 221 207 192   Lipid Panel  No results found for: CHOL, TRIG, HDL, CHOLHDL, VLDL, LDLCALC, LDLDIRECT HEMOGLOBIN A1C No results found for: HGBA1C, MPG TSH No results for input(s): TSH in the last 8760 hours.  External labs:  Lipid Panel completed 07/25/2019 Cholesterol, total 154.000 01/25/2016 Triglycerides 122.000 07/25/2019 HDL 29.000 07/25/2019 LDL-C 93.000 M 01/25/2016  PSA 1.580 07/25/2019  Glucose Random 0.000 05/04/2017 Soto 14.000 03/15/2017 Creatinine, Serum 0.970 02/21/2017  TSH 1.240 02/28/2017  A1C 5.700 % 01/25/2016   Medications and allergies  No Known Allergies   Current Outpatient Medications  Medication Instructions  . acetaminophen (TYLENOL) 500 mg, Oral, 2 times daily  . albuterol (VENTOLIN HFA) 108 (90 Base) MCG/ACT inhaler 2 puffs q.i.d. p.r.n. short of breath, wheezing, or cough  . amLODipine (NORVASC) 10 mg, Oral, Daily at bedtime  . Cinnamon Bark POWD Does not apply  . Ginger 500 MG CAPS Oral, 2 times daily  . Magnesium 500 MG CAPS 1,000 capsules,  Oral, Daily  . metoprolol succinate (TOPROL-XL) 200 MG 24 hr tablet TAKE 1 TABLET BY MOUTH DAILY WITH FOOD  . spironolactone (ALDACTONE) 25 mg, Oral, BH-each morning  . Turmeric (QC TUMERIC COMPLEX PO) Oral  . valsartan-hydrochlorothiazide (DIOVAN-HCT) 320-12.5 MG tablet 1 tablet, Oral, Daily   Radiology:   No results found.  Cardiac Studies:   Sleep Study 2012: Severe Obstructive Sleep apnea: On CPAP and compliant.  External Pharmacological Stress Test 06/12/2012: ECG Conclusions: Within normal limits. Normal pattern of perfusion in all myocardial regions.  Lexiscan (Walking with Jaci Carrel) Sestamibi Stress Test 04/12/2020: Lexiscan stress. Patient achieved THR with mod Bruce protocol. No ST-T changes of ischemia. The heart rate response was accelerated. The baseline blood pressure was 198/110 mmHg. Maximum blood pressure post injection was 238/118 mmHg. Hypertensive throughout.  Myocardial perfusion is normal. Overall LV systolic function is abnormal without regional wall motion abnormalities. Stress LV EF: 44%.  Findings suggest non ischemic cardiomyopathy.  No previous exam available for comparison.  Intermediate risk.  Echocardiogram 04/20/2020:  Normal LV systolic function with visual EF 50-55%. Left ventricle cavity is normal in size. Mild to Moderate left ventricular hypertrophy. Normal  global wall motion. Indeterminate diastolic filling pattern, elevated LAP.  Moderate LA enlargement at 4.7 cm. Mild (Grade I) mitral regurgitation.  Mild tricuspid regurgitation.  The aortic root is mildly dilated at 3.8 cm. Proximal ascending aorta not well visualized.  Compared to prior study dated 04/04/2017 Mild MR and TR are new, aortic dilatation is new.   EKG   EKG 11/15/2020: Normal sinus rhythm at rate of 70 bpm, left axis deviation, left intrafascicular block.  Right bundle branch block.  Bifascicular block.  Low-voltage complexes, poor R wave progression, pulmonary disease  pattern. No significant change from 03/31/2020.  Assessment     ICD-10-CM   1. Essential hypertension  I10 EKG 12-Lead    spironolactone (ALDACTONE) 25 MG tablet    metoprolol succinate (TOPROL-XL) 200 MG 24 hr tablet  2. Palpitations  R00.2 metoprolol succinate (TOPROL-XL) 200 MG 24 hr  tablet  3. Shortness of breath  R06.02   4. Class 3 severe obesity due to excess calories with serious comorbidity and body mass index (BMI) of 50.0 to 59.9 in adult [facility])  E66.01    Z68.43     Meds ordered this encounter  Medications  . spironolactone (ALDACTONE) 25 MG tablet    Sig: Take 1 tablet (25 mg total) by mouth every morning.    Dispense:  90 tablet    Refill:  3  . metoprolol succinate (TOPROL-XL) 200 MG 24 hr tablet    Sig: TAKE 1 TABLET BY MOUTH DAILY WITH FOOD    Dispense:  90 tablet    Refill:  3    Medications Discontinued During This Encounter  Medication Reason  . spironolactone (ALDACTONE) 25 MG tablet Change in therapy  . valsartan-hydrochlorothiazide (DIOVAN-HCT) 320-12.5 MG tablet Change in therapy  . metoprolol succinate (TOPROL-XL) 100 MG 24 hr tablet     Recommendations:   Leslie Soto  is a 59 y.o. male  with past medical history of morbid obesity, hypertension, atrial bigeminy noted on EKG during colonoscopy, obstructive sleep apnea on CPAP therapy and has had recurrent episodes of nephrolithiasis. He has also been noted to have right bundle branch block since January 2020.    This is 22-month office visit for hypertension, dyspnea.  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  He plans to retire in 3 months when he turns 59 years of age.  He was not very sure about his medications, advised him to bring medications and also possibly a list on  every visit.  I refilled his spironolactone which he had discontinued recently as he ran out of the prescription.  I also increase his metoprolol succinate from 100 mg to 200 mg daily.  I would like to see him back.  In 6 weeks for follow-up.  He does have mild aortic root dilatation at 3.8 cm, it may be related to uncontrolled hypertension.  Will consider repeating an echocardiogram in 6 months to a year.  I encouraged him to make lifestyle changes again and to start working towards his retirement, discussions regarding stroke. Weight loss discussed.  Plan coronary artery disease and MI discussed.   Adrian Prows, MD, [facility] 11/15/2020, 3:03 PM Office: [phone] Pager: [phone]

## 2020-12-28 DIAGNOSIS — S80812A Abrasion, left lower leg, initial encounter: Secondary | ICD-10-CM | POA: Diagnosis not present

## 2020-12-28 DIAGNOSIS — I10 Essential (primary) hypertension: Secondary | ICD-10-CM | POA: Diagnosis not present

## 2020-12-28 DIAGNOSIS — E785 Hyperlipidemia, unspecified: Secondary | ICD-10-CM | POA: Diagnosis not present

## 2020-12-28 DIAGNOSIS — Z0000 Encounter for general adult medical examination without abnormal findings: Secondary | ICD-10-CM | POA: Diagnosis not present

## 2020-12-28 DIAGNOSIS — Z125 Encounter for screening for malignant neoplasm of prostate: Secondary | ICD-10-CM | POA: Diagnosis not present

## 2020-12-28 DIAGNOSIS — R7309 Other abnormal glucose: Secondary | ICD-10-CM | POA: Diagnosis not present

## 2020-12-28 DIAGNOSIS — G4733 Obstructive sleep apnea (adult) (pediatric): Secondary | ICD-10-CM | POA: Diagnosis not present

## 2020-12-28 DIAGNOSIS — Z23 Encounter for immunization: Secondary | ICD-10-CM | POA: Diagnosis not present

## 2020-12-28 DIAGNOSIS — N200 Calculus of kidney: Secondary | ICD-10-CM | POA: Diagnosis not present

## 2021-01-10 ENCOUNTER — Ambulatory Visit: Payer: BC Managed Care – PPO | Admitting: Cardiology

## 2021-02-07 DIAGNOSIS — I2510 Atherosclerotic heart disease of native coronary artery without angina pectoris: Secondary | ICD-10-CM | POA: Diagnosis not present

## 2021-02-07 DIAGNOSIS — I10 Essential (primary) hypertension: Secondary | ICD-10-CM | POA: Diagnosis not present

## 2021-02-07 DIAGNOSIS — R7309 Other abnormal glucose: Secondary | ICD-10-CM | POA: Diagnosis not present

## 2021-03-22 ENCOUNTER — Ambulatory Visit: Payer: BC Managed Care – PPO | Admitting: Cardiology

## 2021-04-19 DIAGNOSIS — R310 Gross hematuria: Secondary | ICD-10-CM | POA: Diagnosis not present

## 2021-04-25 DIAGNOSIS — Z125 Encounter for screening for malignant neoplasm of prostate: Secondary | ICD-10-CM | POA: Diagnosis not present

## 2021-04-25 DIAGNOSIS — N200 Calculus of kidney: Secondary | ICD-10-CM | POA: Diagnosis not present

## 2021-04-25 DIAGNOSIS — Z8042 Family history of malignant neoplasm of prostate: Secondary | ICD-10-CM | POA: Diagnosis not present

## 2021-04-26 ENCOUNTER — Other Ambulatory Visit: Payer: Self-pay | Admitting: Internal Medicine

## 2021-04-26 DIAGNOSIS — R310 Gross hematuria: Secondary | ICD-10-CM

## 2021-04-28 ENCOUNTER — Other Ambulatory Visit: Payer: Self-pay

## 2021-04-28 ENCOUNTER — Ambulatory Visit: Payer: BC Managed Care – PPO | Admitting: Cardiology

## 2021-04-28 ENCOUNTER — Encounter: Payer: Self-pay | Admitting: Cardiology

## 2021-04-28 VITALS — BP 164/85 | HR 72 | Temp 98.6°F | Resp 17 | Ht 70.0 in | Wt 348.8 lb

## 2021-04-28 DIAGNOSIS — Z6841 Body Mass Index (BMI) 40.0 and over, adult: Secondary | ICD-10-CM

## 2021-04-28 DIAGNOSIS — E785 Hyperlipidemia, unspecified: Secondary | ICD-10-CM

## 2021-04-28 DIAGNOSIS — R0602 Shortness of breath: Secondary | ICD-10-CM

## 2021-04-28 DIAGNOSIS — I10 Essential (primary) hypertension: Secondary | ICD-10-CM

## 2021-04-28 DIAGNOSIS — R002 Palpitations: Secondary | ICD-10-CM | POA: Diagnosis not present

## 2021-04-28 DIAGNOSIS — N200 Calculus of kidney: Secondary | ICD-10-CM

## 2021-04-28 MED ORDER — METOPROLOL SUCCINATE ER 200 MG PO TB24: ORAL_TABLET | ORAL | 3 refills | Status: DC

## 2021-04-28 MED ORDER — ATORVASTATIN CALCIUM 10 MG PO TABS: 10.0000 mg | ORAL_TABLET | Freq: Every day | ORAL | 3 refills | Status: DC

## 2021-04-28 MED ORDER — HYDRALAZINE HCL 50 MG PO TABS: 50.0000 mg | ORAL_TABLET | Freq: Three times a day (TID) | ORAL | 2 refills | Status: DC | PRN

## 2021-04-28 NOTE — Progress Notes (Signed)
Primary Physician/Referring:  Deland Pretty, MD  Patient ID: Leslie Soto, male    DOB: February 11, 1961, 60 y.o.   MRN: [id number]  Chief Complaint  Patient presents with  . Follow-up  . Hypertension    6 month   HPI:    Leslie Soto  is a 60 y.o. male  with past medical history of morbid obesity, hypertension, atrial bigeminy noted on EKG during colonoscopy, obstructive sleep apnea on CPAP therapy and has had recurrent episodes of nephrolithiasis. He has also been noted to have right bundle branch block since January 2020.    This is 11-month office visit for hypertension, dyspnea.  Dyspnea had improved and blood pressure was controlled with weight loss and making lifestyle changes, unfortunately he has developed renal stones and has been in significant pain and since then his blood pressure has been uncontrolled.  Brings home recordings which has been under excellent control. He has not had any neurologic deficits, no syncope, no chest pain. He has been compliant with CPAP.  Past Medical History:  Diagnosis Date  . Abnormal EKG    hx right bundle branck block on ekg  . Asthma    cold weather induced  . Bronchial asthma 02/18/2019  . Degenerative disc disease    ?  . Gastric ulcer with hemorrhage 08/28/2012  . GI bleed due to NSAIDs   . History of kidney stones   . Hypertension   . Laboratory examination 02/18/2019  . Lower back pain   . Obesity (BMI 35.0-39.9 without comorbidity)   . OSA (obstructive sleep apnea)    Dr. Vertell Limber, [place]-uses cpap at night  set on 10-8  . Palpitations 02/18/2019  . Pneumonia 8 years ago   walking pneumonia   . Right bundle branch block (RBBB) 02/18/2019  . Shortness of breath 02/18/2019  . Spina bifida ([place])    L 2 or L 3 defect creating left leg pain    Past Surgical History:  Procedure Laterality Date  . CARDIOVASCULAR STRESS TEST  06/12/2012   Normal pattern of perfusion in all regions, no ECG changes, EKG negative for ischemia.  . COLONOSCOPY  WITH PROPOFOL N/A 12/07/2017   Procedure: COLONOSCOPY WITH PROPOFOL;  Surgeon: Carol Ada, MD;  Location: [facility];  Service: Endoscopy;  Laterality: N/A;  . CYSTOSCOPY/URETEROSCOPY/HOLMIUM LASER/STENT PLACEMENT Bilateral 01/31/2017   Procedure: CYSTOSCOPY/BILATERAL RETROGRADE PYELOGRAM/ LEFT URETEROSCOPYLEFT /HOLMIUM LASER/BILATERAL STENT PLACEMENT;  Surgeon: Alexis Frock, MD;  Location: [facility];  Service: Urology;  Laterality: Bilateral;  . CYSTOSCOPY/URETEROSCOPY/HOLMIUM LASER/STENT PLACEMENT Bilateral 02/21/2017   Procedure: CYSTOSCOPY/URETEROSCOPY/HOLMIUM LASER/STENT REPLACEMENT BILATERAL RETROGRADE;  Surgeon: Alexis Frock, MD;  Location: [facility];  Service: Urology;  Laterality: Bilateral;  . ESOPHAGOGASTRODUODENOSCOPY  08/28/2012   Procedure: ESOPHAGOGASTRODUODENOSCOPY (EGD);  Surgeon: Inda Castle, MD;  Location: [place];  Service: Endoscopy;  Laterality: N/A;  . ESOPHAGOGASTRODUODENOSCOPY (EGD) WITH PROPOFOL N/A 12/07/2017   Procedure: ESOPHAGOGASTRODUODENOSCOPY (EGD) WITH PROPOFOL;  Surgeon: Carol Ada, MD;  Location: [facility];  Service: Endoscopy;  Laterality: N/A;  . EUS N/A 01/04/2018   Procedure: UPPER ENDOSCOPIC ULTRASOUND (EUS) LINEAR;  Surgeon: Carol Ada, MD;  Location: [facility];  Service: Endoscopy;  Laterality: N/A;  . EXTRACORPOREAL SHOCK WAVE LITHOTRIPSY     2003  . EYE SURGERY Bilateral    1969 weak eye muscle  . FINE NEEDLE ASPIRATION N/A 01/04/2018   Procedure: FINE NEEDLE ASPIRATION (FNA) LINEAR;  Surgeon: Carol Ada, MD;  Location: [facility];  Service: Endoscopy;  Laterality: N/A;  . HERNIA REPAIR  09/20/1983   85, 84  . HERNIA REPAIR  09/25/1997  . KNEE ARTHROSCOPY Left 01/20/2011  . TRANSTHORACIC ECHOCARDIOGRAM  04/03/2013   EF 55-60%, moderate concentric hypertrophy,    Family History  Problem Relation Age of Onset  . Mental illness Father   . Heart disease Father   . Diabetes Father   . Hypertension Father   . Stroke Mother    . Hypertension Mother   . Hypertension Brother   . Prostate cancer Brother   . Prostate cancer Brother     Social History   Tobacco Use  . Smoking status: Never Smoker  . Smokeless tobacco: Never Used  Substance Use Topics  . Alcohol use: No   Marital Status: Widowed ROS  Review of Systems  Constitutional: Positive for malaise/fatigue.  Cardiovascular: Positive for chest pain, dyspnea on exertion and palpitations. Negative for leg swelling.  Musculoskeletal: Positive for back pain and joint pain.  Gastrointestinal: Negative for melena.   Objective  Blood pressure (!) 164/85, pulse 72, temperature 98.6 F (37 C), temperature source Temporal, resp. rate 17, height 5\' 10"  (1.778 m), weight (!) 348 lb 12.8 oz (158.2 kg), SpO2 96 %.  Vitals with BMI 04/28/2021 04/28/2021 11/15/2020  Height - 5\' 10"  -  Weight - 348 lbs 13 oz -  BMI - [id number] -  Systolic 123456 0000000 0000000  Diastolic 85 97 [id number]  Pulse 72 77 71     Physical Exam Constitutional:      Comments: Morbidly obese in no acute distress.  Cardiovascular:     Rate and Rhythm: Normal rate and regular rhythm.     Pulses:          Carotid pulses are 2+ on the right side and 2+ on the left side.      Dorsalis pedis pulses are 2+ on the right side and 2+ on the left side.       Posterior tibial pulses are 2+ on the right side and 2+ on the left side.     Heart sounds: Normal heart sounds. No murmur heard. No gallop.      Comments: Femoral and popliteal pulse difficult to feel due to patient's body habitus.  No leg edema. JVD difficult to see due to short neck. Pulmonary:     Effort: Pulmonary effort is normal.     Breath sounds: Normal breath sounds.  Abdominal:     General: Bowel sounds are normal.     Palpations: Abdomen is soft.     Comments: Obese. Pannus present    Laboratory examination:   No results for input(s): NA, K, CL, CO2, GLUCOSE, Soto, CREATININE, CALCIUM, GFRNONAA, GFRAA in the last 8760 hours. CrCl cannot be  calculated (Patient's most recent lab result is older than the maximum 21 days allowed.).  CMP Latest Ref Rng & Units 02/21/2017 01/30/2017 08/26/2012  Glucose 65 - 99 mg/dL 101(H) 111(H) 111(H)  Soto 6 - 20 mg/dL 17 15 36(H)  Creatinine 0.61 - 1.24 mg/dL 0.97 0.74 0.77  Sodium 135 - 145 mmol/L 138 139 141  Potassium 3.5 - 5.1 mmol/L 4.1 3.9 4.1  Chloride 101 - 111 mmol/L 108 108 109  CO2 22 - 32 mmol/L 22 21(L) 22  Calcium 8.9 - 10.3 mg/dL 8.9 9.6 9.8  Total Protein 6.0 - 8.3 g/dL - - 6.6  Total Bilirubin 0.3 - 1.2 mg/dL - - 0.7  Alkaline Phos 39 - 117 U/L - - 74  AST 0 - 37 U/L - - 13  ALT 0 - 53 U/L - - 19   CBC Latest Ref Rng & Units 02/21/2017 01/30/2017 08/28/2012  WBC 4.0 - 10.5 K/uL 12.4(H) 11.4(H) 12.2(H)  Hemoglobin 13.0 - 17.0 g/dL 15.0 16.9 10.5(L)  Hematocrit 39.0 - 52.0 % 43.3 47.8 30.7(L)  Platelets 150 - 400 K/uL 221 207 192   External labs:  Lipid Panel completed 07/25/2019 Cholesterol, total 154.000 01/25/2016 Triglycerides 122.000 07/25/2019 HDL 29.000 07/25/2019 LDL-C 93.000 M 01/25/2016  PSA 1.580 07/25/2019  Glucose Random 0.000 05/04/2017 Soto 14.000 03/15/2017 Creatinine, Serum 0.970 02/21/2017  TSH 1.240 02/28/2017  A1C 5.700 % 01/25/2016   Medications and allergies  No Known Allergies   Current Outpatient Medications on File Prior to Visit  Medication Sig Dispense Refill  . acetaminophen (TYLENOL) 500 MG tablet Take 500 mg by mouth 2 (two) times daily.    Marland Kitchen amLODipine (NORVASC) 10 MG tablet Take 10 mg by mouth at bedtime.     Wallace Cullens POWD by Does not apply route.    . Ginger 500 MG CAPS Take by mouth 2 (two) times daily.    Donnie Aho (GLUCOSAMINE MSM COMPLEX) TABS Take 1 tablet by mouth daily.    . Magnesium 500 MG CAPS Take 1,000 capsules by mouth daily.     . Omega-3 Fatty Acids (FISH OIL) 1000 MG CAPS Take 1 capsule by mouth daily.    . Turmeric-Ginger 150-25 MG CHEW Chew 1 capsule by mouth daily.    . valsartan-hydrochlorothiazide  (DIOVAN-HCT) 320-12.5 MG tablet Take 1 tablet by mouth daily.     No current facility-administered medications on file prior to visit.    Radiology:   No results found.  Cardiac Studies:   Sleep Study 2012: Severe Obstructive Sleep apnea: On CPAP and compliant.  External Pharmacological Stress Test 06/12/2012: ECG Conclusions: Within normal limits. Normal pattern of perfusion in all myocardial regions.  Lexiscan (Walking with Jaci Carrel) Sestamibi Stress Test 04/12/2020: Lexiscan stress. Patient achieved THR with mod Bruce protocol. No ST-T changes of ischemia. The heart rate response was accelerated. The baseline blood pressure was 198/110 mmHg. Maximum blood pressure post injection was 238/118 mmHg. Hypertensive throughout.  Myocardial perfusion is normal. Overall LV systolic function is abnormal without regional wall motion abnormalities. Stress LV EF: 44%.  Findings suggest non ischemic cardiomyopathy.  No previous exam available for comparison.  Intermediate risk.  Echocardiogram 04/20/2020:  Normal LV systolic function with visual EF 50-55%. Left ventricle cavity is normal in size. Mild to Moderate left ventricular hypertrophy. Normal  global wall motion. Indeterminate diastolic filling pattern, elevated LAP.  Moderate LA enlargement at 4.7 cm. Mild (Grade I) mitral regurgitation.  Mild tricuspid regurgitation.  The aortic root is mildly dilated at 3.8 cm. Proximal ascending aorta not well visualized.  Compared to prior study dated 04/04/2017 Mild MR and TR are new, aortic dilatation is new.   EKG   EKG 04/28/2021: Normal sinus rhythm at rate of 64 bpm, left axis deviation, left anterior fascicular block.  Right bundle branch block.  Bifascicular block.  Low-voltage complexes.  Pulmonary disease pattern.  No significant change from 11/15/2020.    Assessment     ICD-10-CM   1. Essential hypertension  I10 EKG 12-Lead    metoprolol (TOPROL-XL) 200 MG 24 hr tablet     hydrALAZINE (APRESOLINE) 50 MG tablet  2. Shortness of breath  R06.02   3. Class 3 severe obesity due to excess calories with serious comorbidity and body mass index (BMI) of 50.0 to 59.9  in adult [facility])  E66.01    Z68.43   4. Palpitations  R00.2 metoprolol (TOPROL-XL) 200 MG 24 hr tablet  5. Kidney stones  N20.0   6. Mild hyperlipidemia  E78.5 atorvastatin (LIPITOR) 10 MG tablet    Meds ordered this encounter  Medications  . metoprolol (TOPROL-XL) 200 MG 24 hr tablet    Sig: TAKE 1 TABLET BY MOUTH DAILY WITH FOOD    Dispense:  90 tablet    Refill:  3  . hydrALAZINE (APRESOLINE) 50 MG tablet    Sig: Take 1 tablet (50 mg total) by mouth 3 (three) times daily as needed (BP >150/90 mm Hg.).    Dispense:  90 tablet    Refill:  2  . atorvastatin (LIPITOR) 10 MG tablet    Sig: Take 1 tablet (10 mg total) by mouth daily.    Dispense:  90 tablet    Refill:  3    Recommendations:   SHAKUR LEMBO  is a 60 y.o. male  with past medical history of morbid obesity, hypertension, atrial bigeminy noted on EKG during colonoscopy, obstructive sleep apnea on CPAP therapy and has had recurrent episodes of nephrolithiasis. He has also been noted to have right bundle branch block since January 2020.    This is 31-month office visit for hypertension, dyspnea.  Dyspnea had improved and blood pressure was controlled with weight loss and making lifestyle changes, unfortunately he has developed renal stones and has been in significant pain and since then his blood pressure has been uncontrolled.  Brings home recordings which has been under excellent control.  Initially had thought of changing metoprolol to labetalol but decided against this.  I have prescribed him hydralazine 50 mg 3 times daily as needed to be used if blood pressure >150/90 mmHg.  He has been losing weight, he is making significant lifestyle changes with regard to his diet and exercise and physical activity.  His last day of work is 06/23/2021  after which he plans to retire and he is excited about this as he wants to take care of his health and enjoy life.  He appears very motivated.  In view of his risk factors including morbid obesity, hypertension, age, he has had discussions with Dr. Deland Pretty, I agree to start him on low-dose statin for cardiovascular protection.  I will see him back in 4 months for follow-up.  I was very happy to see him today.      Adrian Prows, MD, [facility] 04/28/2021, 4:04 PM Office: [phone] Pager: [phone]

## 2021-05-04 DIAGNOSIS — M1711 Unilateral primary osteoarthritis, right knee: Secondary | ICD-10-CM | POA: Diagnosis not present

## 2021-05-09 DIAGNOSIS — M170 Bilateral primary osteoarthritis of knee: Secondary | ICD-10-CM | POA: Diagnosis not present

## 2021-06-27 DIAGNOSIS — H04123 Dry eye syndrome of bilateral lacrimal glands: Secondary | ICD-10-CM | POA: Diagnosis not present

## 2021-08-16 ENCOUNTER — Ambulatory Visit: Payer: BC Managed Care – PPO | Admitting: Student

## 2021-08-29 ENCOUNTER — Ambulatory Visit: Payer: BC Managed Care – PPO | Admitting: Cardiology

## 2021-11-05 ENCOUNTER — Emergency Department
Admission: EM | Admit: 2021-11-05 | Discharge: 2021-11-05 | Disposition: A | Discharge status: Home / Self Care | Payer: Self-pay | Attending: Emergency Medicine | Admitting: Emergency Medicine

## 2021-11-05 ENCOUNTER — Emergency Department: Payer: Self-pay

## 2021-11-05 ENCOUNTER — Other Ambulatory Visit: Payer: Self-pay

## 2021-11-05 DIAGNOSIS — I10 Essential (primary) hypertension: Secondary | ICD-10-CM | POA: Insufficient documentation

## 2021-11-05 DIAGNOSIS — R109 Unspecified abdominal pain: Secondary | ICD-10-CM

## 2021-11-05 DIAGNOSIS — N200 Calculus of kidney: Secondary | ICD-10-CM | POA: Insufficient documentation

## 2021-11-05 DIAGNOSIS — J45909 Unspecified asthma, uncomplicated: Secondary | ICD-10-CM | POA: Insufficient documentation

## 2021-11-05 DIAGNOSIS — Z79899 Other long term (current) drug therapy: Secondary | ICD-10-CM | POA: Insufficient documentation

## 2021-11-05 LAB — CBC
HCT: 49.8 % (ref 39.0–52.0)
Hemoglobin: 16.9 g/dL (ref 13.0–17.0)
MCH: 31 pg (ref 26.0–34.0)
MCHC: 33.9 g/dL (ref 30.0–36.0)
MCV: 91.2 fL (ref 80.0–100.0)
Platelets: 187 10*3/uL (ref 150–400)
RBC: 5.46 MIL/uL (ref 4.22–5.81)
RDW: 14.3 % (ref 11.5–15.5)
WBC: 11.5 10*3/uL — ABNORMAL HIGH (ref 4.0–10.5)
nRBC: 0 % (ref 0.0–0.2)

## 2021-11-05 LAB — BASIC METABOLIC PANEL
Anion gap: 10 (ref 5–15)
BUN: 9 mg/dL (ref 8–23)
CO2: 18 mmol/L — ABNORMAL LOW (ref 22–32)
Calcium: 8.8 mg/dL — ABNORMAL LOW (ref 8.9–10.3)
Chloride: 107 mmol/L (ref 98–111)
Creatinine, Ser: 0.83 mg/dL (ref 0.61–1.24)
GFR, Estimated: >60 mL/min (ref >60)
Glucose, Bld: 159 mg/dL — ABNORMAL HIGH (ref 70–99)
Potassium: 3.6 mmol/L (ref 3.5–5.1)
Sodium: 135 mmol/L (ref 135–145)

## 2021-11-05 LAB — URINALYSIS, ROUTINE W REFLEX MICROSCOPIC
Bilirubin Urine: NEGATIVE
Glucose, UA: NEGATIVE mg/dL
Ketones, ur: NEGATIVE mg/dL
Leukocytes,Ua: NEGATIVE
Nitrite: NEGATIVE
Protein, ur: 100 mg/dL — AB
Specific Gravity, Urine: 1.015 (ref 1.005–1.030)
Squamous Epithelial / HPF: NONE SEEN (ref 0–5)
pH: 5 (ref 5.0–8.0)

## 2021-11-05 MED ORDER — OXYCODONE-ACETAMINOPHEN 7.5-325 MG PO TABS: 1.0000 | ORAL_TABLET | Freq: Four times a day (QID) | ORAL | 0 refills | Status: DC | PRN

## 2021-11-05 MED ORDER — ONDANSETRON 8 MG PO TBDP
8.0000 mg | ORAL_TABLET | Freq: Once | ORAL | Status: AC
  Administered 2021-11-05: 8 mg via ORAL
  Filled 2021-11-05: qty 1

## 2021-11-05 MED ORDER — ONDANSETRON 4 MG PO TBDP: 4.0000 mg | ORAL_TABLET | Freq: Three times a day (TID) | ORAL | 0 refills | Status: DC | PRN

## 2021-11-05 MED ORDER — TAMSULOSIN HCL 0.4 MG PO CAPS
0.4000 mg | ORAL_CAPSULE | Freq: Every day | ORAL | 0 refills | Status: DC
Start: 2021-11-05 — End: 2022-02-06

## 2021-11-05 NOTE — Discharge Instructions (Signed)
Follow-up with your urologist on Monday.  Begin medications as prescribed.  Increase fluids.  Return to the emergency department over the weekend if any worsening of your symptoms, persistent vomiting or fever and chills.

## 2021-11-05 NOTE — ED Provider Notes (Signed)
[facility] Emergency Department Provider Note  ____________________________________________   Event Date/Time   First MD Initiated Contact with Patient 11/05/21 [phone]     (approximate)  I have reviewed the triage vital signs and the nursing notes.   HISTORY  Chief Complaint Flank Pain   HPI Leslie Soto is a 60 y.o. male 60 year old male presents to the ED with complaint of left flank pain that started last evening.  Patient states that he also passed a stone at approximately 9:30 PM.  He continues to have nausea and hematuria this morning.  He has a longstanding history of stones and sees urologist over [facility].  Currently rates his pain as an 8 out of 10.       Past Medical History:  Diagnosis Date   Abnormal EKG    hx right bundle branck block on ekg   Asthma    cold weather induced   Bronchial asthma 02/18/2019   Degenerative disc disease    ?   Gastric ulcer with hemorrhage 08/28/2012   GI bleed due to NSAIDs    History of kidney stones    Hypertension    Laboratory examination 02/18/2019   Lower back pain    Obesity (BMI 35.0-39.9 without comorbidity)    OSA (obstructive sleep apnea)    Dr. Vertell Limber, [place]-uses cpap at night  set on 10-8   Palpitations 02/18/2019   Pneumonia 8 years ago   walking pneumonia    Right bundle branch block (RBBB) 02/18/2019   Shortness of breath 02/18/2019   Spina bifida ([place])    L 2 or L 3 defect creating left leg pain     Patient Active Problem List   Diagnosis Date Noted   Palpitations 02/18/2019   Right bundle branch block (RBBB) 02/18/2019   Shortness of breath 02/18/2019   Laboratory examination 02/18/2019   Bronchial asthma 02/18/2019   Vitamin D deficiency 12/31/2014   Mixed hyperlipidemia 12/31/2014   Acute blood loss anemia 08/27/2012   Sinus tachycardia 08/27/2012   Leukocytosis 08/27/2012   Melena 08/27/2012   GI bleed due to NSAIDs    Lower back pain    Hypertension     Obesity (BMI 35.0-39.9 without comorbidity)    Degenerative disc disease    OSA (obstructive sleep apnea) - nocturnal BiPAP     Past Surgical History:  Procedure Laterality Date   CARDIOVASCULAR STRESS TEST  06/12/2012   Normal pattern of perfusion in all regions, no ECG changes, EKG negative for ischemia.   COLONOSCOPY WITH PROPOFOL N/A 12/07/2017   Procedure: COLONOSCOPY WITH PROPOFOL;  Surgeon: Carol Ada, MD;  Location: [facility];  Service: Endoscopy;  Laterality: N/A;   CYSTOSCOPY/URETEROSCOPY/HOLMIUM LASER/STENT PLACEMENT Bilateral 01/31/2017   Procedure: CYSTOSCOPY/BILATERAL RETROGRADE PYELOGRAM/ LEFT URETEROSCOPYLEFT /HOLMIUM LASER/BILATERAL STENT PLACEMENT;  Surgeon: Alexis Frock, MD;  Location: [facility];  Service: Urology;  Laterality: Bilateral;   CYSTOSCOPY/URETEROSCOPY/HOLMIUM LASER/STENT PLACEMENT Bilateral 02/21/2017   Procedure: CYSTOSCOPY/URETEROSCOPY/HOLMIUM LASER/STENT REPLACEMENT BILATERAL RETROGRADE;  Surgeon: Alexis Frock, MD;  Location: [facility];  Service: Urology;  Laterality: Bilateral;   ESOPHAGOGASTRODUODENOSCOPY  08/28/2012   Procedure: ESOPHAGOGASTRODUODENOSCOPY (EGD);  Surgeon: Inda Castle, MD;  Location: [place];  Service: Endoscopy;  Laterality: N/A;   ESOPHAGOGASTRODUODENOSCOPY (EGD) WITH PROPOFOL N/A 12/07/2017   Procedure: ESOPHAGOGASTRODUODENOSCOPY (EGD) WITH PROPOFOL;  Surgeon: Carol Ada, MD;  Location: [facility];  Service: Endoscopy;  Laterality: N/A;   EUS N/A 01/04/2018   Procedure: UPPER ENDOSCOPIC ULTRASOUND (EUS) LINEAR;  Surgeon: Carol Ada,  MD;  Location: [facility];  Service: Endoscopy;  Laterality: N/A;   EXTRACORPOREAL SHOCK WAVE LITHOTRIPSY     2003   EYE SURGERY Bilateral    1969 weak eye muscle   FINE NEEDLE ASPIRATION N/A 01/04/2018   Procedure: FINE NEEDLE ASPIRATION (FNA) LINEAR;  Surgeon: Carol Ada, MD;  Location: [facility];  Service: Endoscopy;  Laterality: N/A;   HERNIA REPAIR  09/20/1983   87, 86    HERNIA REPAIR  09/25/1997   KNEE ARTHROSCOPY Left 01/20/2011   TRANSTHORACIC ECHOCARDIOGRAM  04/03/2013   EF 55-60%, moderate concentric hypertrophy,     Prior to Admission medications   Medication Sig Start Date End Date Taking? Authorizing Provider  ondansetron (ZOFRAN ODT) 4 MG disintegrating tablet Take 1 tablet (4 mg total) by mouth every 8 (eight) hours as needed for nausea or vomiting. 11/05/21  Yes Johnn Hai, PA-C  oxyCODONE-acetaminophen (PERCOCET) 7.5-325 MG tablet Take 1 tablet by mouth every 6 (six) hours as needed for severe pain. 11/05/21 11/05/22 Yes Johnn Hai, PA-C  tamsulosin (FLOMAX) 0.4 MG CAPS capsule Take 1 capsule (0.4 mg total) by mouth daily. 11/05/21  Yes Johnn Hai, PA-C  acetaminophen (TYLENOL) 500 MG tablet Take 500 mg by mouth 2 (two) times daily.    [provider]  amLODipine (NORVASC) 10 MG tablet Take 10 mg by mouth at bedtime.     [provider]  atorvastatin (LIPITOR) 10 MG tablet Take 1 tablet (10 mg total) by mouth daily. 04/28/21 04/23/22  Adrian Prows, MD  Wallace Cullens POWD by Does not apply route.    [provider]  Ginger 500 MG CAPS Take by mouth 2 (two) times daily.    [provider]  Glucos-MSM-C-Mn-Ginger-Willow (GLUCOSAMINE MSM COMPLEX) TABS Take 1 tablet by mouth daily.    [provider]  hydrALAZINE (APRESOLINE) 50 MG tablet Take 1 tablet (50 mg total) by mouth 3 (three) times daily as needed (BP >150/90 mm Hg.). 04/28/21 07/27/21  Adrian Prows, MD  Magnesium 500 MG CAPS Take 1,000 capsules by mouth daily.     [provider]  metoprolol (TOPROL-XL) 200 MG 24 hr tablet TAKE 1 TABLET BY MOUTH DAILY WITH FOOD 04/28/21   Adrian Prows, MD  Omega-3 Fatty Acids (FISH OIL) 1000 MG CAPS Take 1 capsule by mouth daily.    [provider]  Turmeric-Ginger 150-25 MG CHEW Chew 1 capsule by mouth daily.    [provider]  valsartan-hydrochlorothiazide (DIOVAN-HCT) 320-12.5  MG tablet Take 1 tablet by mouth daily.    [provider]    Allergies Patient has no known allergies.  Family History  Problem Relation Age of Onset   Mental illness Father    Heart disease Father    Diabetes Father    Hypertension Father    Stroke Mother    Hypertension Mother    Hypertension Brother    Prostate cancer Brother    Prostate cancer Brother     Social History Social History   Tobacco Use   Smoking status: Never   Smokeless tobacco: Never  Vaping Use   Vaping Use: Never used  Substance Use Topics   Alcohol use: No   Drug use: No    Review of Systems Constitutional: No fever/chills Eyes: No visual changes. ENT: No sore throat. Cardiovascular: Denies chest pain. Respiratory: Denies shortness of breath. Gastrointestinal: No abdominal pain.  Positive nausea, no vomiting.  No diarrhea.  Genitourinary: Positive hematuria.  Positive history of multiple  kidney stones.  Positive left flank pain. Musculoskeletal: Negative for musculoskeletal pain. Skin: Negative for rash. Neurological: Negative for headaches, focal weakness or numbness.  ____________________________________________   PHYSICAL EXAM:  VITAL SIGNS: ED Triage Vitals  Enc Vitals Group     BP 11/05/21 0740 (!) 188/103     Pulse Rate 11/05/21 0740 76     Resp 11/05/21 0740 (!) 22     Temp 11/05/21 0740 98.2 F (36.8 C)     Temp Source 11/05/21 0740 Oral     SpO2 11/05/21 0740 96 %     Weight 11/05/21 0739 (!) 350 lb (158.8 kg)     Height 11/05/21 0739 5\' 10"  (1.778 m)     Head Circumference --      Peak Flow --      Pain Score 11/05/21 0739 8     Pain Loc --      Pain Edu? --      Excl. in [place]? --     Constitutional: Alert and oriented. Well appearing and in no acute distress. Eyes: Conjunctivae are normal. PERRL. EOMI. Head: Atraumatic. Neck: No stridor.   Cardiovascular: Normal rate, regular rhythm. Grossly normal heart sounds.  Good peripheral  circulation. Respiratory: Normal respiratory effort.  No retractions. Lungs CTAB. Gastrointestinal: Soft and nontender. No distention.  Musculoskeletal: Is able move upper and lower extremities that any difficulty. Neurologic:  Normal speech and language. No gross focal neurologic deficits are appreciated. No gait instability. Skin:  Skin is warm, dry and intact. Psychiatric: Mood and affect are normal. Speech and behavior are normal.  ____________________________________________   LABS (all labs ordered are listed, but only abnormal results are displayed)  Labs Reviewed  URINALYSIS, ROUTINE W REFLEX MICROSCOPIC - Abnormal; Notable for the following components:      Result Value   Color, Urine YELLOW (*)    APPearance CLEAR (*)    Hgb urine dipstick SMALL (*)    Protein, ur 100 (*)    Bacteria, UA RARE (*)    All other components within normal limits  BASIC METABOLIC PANEL - Abnormal; Notable for the following components:   CO2 18 (*)    Glucose, Bld 159 (*)    Calcium 8.8 (*)    All other components within normal limits  CBC - Abnormal; Notable for the following components:   WBC 11.5 (*)    All other components within normal limits   ____________________________________________  ___________________________________________  RADIOLOGY Leana Gamer, personally viewed and evaluated these images (plain radiographs) as part of my medical decision making, as well as reviewing the written report by the radiologist.    Official radiology report(s): CT Renal Stone Study  Result Date: 11/05/2021 CLINICAL DATA:  Left flank pain with nausea.  Hematuria. EXAM: CT ABDOMEN AND PELVIS WITHOUT CONTRAST TECHNIQUE: Multidetector CT imaging of the abdomen and pelvis was performed following the standard protocol without IV contrast. COMPARISON:  CT abdomen pelvis 01/25/2017 FINDINGS: Lower chest: No acute abnormality. Evaluation of the abdominal viscera limited by the lack of IV  contrast. Of Hepatobiliary: Diffuse hepatic steatosis. No focal liver abnormality is seen. Normal appearance of the gallbladder. Pancreas: Unremarkable.  No surrounding inflammatory changes. Spleen: Normal in size without focal abnormality. Adrenals/Urinary Tract: Adrenal glands are unremarkable. Multiple small nonobstructing right renal calculi. No right hydronephrosis. There are multiple small left renal calculi. The largest measures 8 mm. There is mild left hydronephrosis secondary to an obstructing calculus in the superior left ureter measuring 7 mm (  series 2, image 51). There is left perinephric stranding. Urinary bladder is unremarkable. Stomach/Bowel: Stomach is within normal limits. Appendix appears normal. No evidence of bowel wall thickening, distention, or inflammatory changes. Colonic diverticula without evidence of diverticulitis. Vascular/Lymphatic: Aortic atherosclerosis. 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 Vascular patency cannot be assessed in the absence of IV contrast. No enlarged abdominal or pelvic lymph nodes. Reproductive: Prostate is mildly enlarged measuring 5.4 cm. Other: Bilateral fat containing inguinal hernias. Musculoskeletal: Multilevel degenerative disc disease in the lumbar spine. IMPRESSION: 1. There is mild left hydronephrosis secondary to an obstructing 7 mm calculus in the superior left ureter. Associated left perinephric stranding. 2. Multiple additional bilateral small renal calculi. 3. Hepatic steatosis. 4. Colonic diverticulosis without evidence of diverticulitis. 5. 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 6. Aortic atherosclerosis. Aortic Atherosclerosis (ICD10-I70.0).2 Electronically Signed   By: Audie Pinto M.D.   On: 11/05/2021 11:53    ____________________________________________   PROCEDURES  Procedure(s) performed (including Critical  Care):  Procedures   ____________________________________________   INITIAL IMPRESSION / ASSESSMENT AND PLAN / ED COURSE  As part of my medical decision making, I reviewed the following data within the electronic MEDICAL RECORD NUMBER Notes from prior ED visits and [place] Controlled Substance Database  60 year old male presents to the ED with complaint of left flank pain that started last evening and also passing of 1 stone last evening as well.  Patient has a history of multiple kidney stones and has a urologist at [facility] that he sees for multiple stones.  Patient currently has some nausea but no vomiting.  He states he has been able to pass large stones but also has had lithotripsy and surgery to move stones.  CT scan shows multiple stones with a left 7 mm stone obstructing without hydronephrosis.  We discussed increasing fluids, discharge medications including Zofran, Percocet and Flomax.  Patient will follow-up with his urologist at [facility] if any continued problems.  He also was told to return to the emergency department over the weekend should he develop any persistent vomiting, fever or chills or worsening of his symptoms. ____________________________________________   FINAL CLINICAL IMPRESSION(S) / ED DIAGNOSES  Final diagnoses:  Left flank pain  Kidney stone     ED Discharge Orders          Ordered    tamsulosin (FLOMAX) 0.4 MG CAPS capsule  Daily        11/05/21 1224    oxyCODONE-acetaminophen (PERCOCET) 7.5-325 MG tablet  Every 6 hours PRN        11/05/21 1224    ondansetron (ZOFRAN ODT) 4 MG disintegrating tablet  Every 8 hours PRN        11/05/21 1224             Note:  This document was prepared using Dragon voice recognition software and may include unintentional dictation errors.    Johnn Hai, PA-C 11/05/21 1240    Carrie Mew, MD 11/05/21 1415

## 2021-11-05 NOTE — ED Notes (Signed)
See triage note. Pt ambulatory to room. Pt c/o left sided kidney pain and passing of small kidney stones. Pt stating concern for dilation. Urine sample obtained.

## 2021-11-05 NOTE — ED Triage Notes (Signed)
Pt to ED for left flank pain that started last night with nausea. +hematuria. Hx kidney stones.    Hx HTN, has not taken meds today

## 2022-02-06 ENCOUNTER — Emergency Department (HOSPITAL_COMMUNITY): Payer: BC Managed Care – PPO

## 2022-02-06 ENCOUNTER — Other Ambulatory Visit: Payer: Self-pay

## 2022-02-06 ENCOUNTER — Observation Stay (HOSPITAL_COMMUNITY)
Admission: EM | Admit: 2022-02-06 | Discharge: 2022-02-09 | Disposition: A | Discharge status: Home / Self Care | Payer: BC Managed Care – PPO | Attending: Cardiology | Admitting: Cardiology

## 2022-02-06 DIAGNOSIS — Z79899 Other long term (current) drug therapy: Secondary | ICD-10-CM | POA: Insufficient documentation

## 2022-02-06 DIAGNOSIS — I4892 Unspecified atrial flutter: Secondary | ICD-10-CM | POA: Diagnosis present

## 2022-02-06 DIAGNOSIS — I480 Paroxysmal atrial fibrillation: Principal | ICD-10-CM | POA: Insufficient documentation

## 2022-02-06 DIAGNOSIS — Z6835 Body mass index (BMI) 35.0-35.9, adult: Secondary | ICD-10-CM | POA: Insufficient documentation

## 2022-02-06 DIAGNOSIS — I499 Cardiac arrhythmia, unspecified: Secondary | ICD-10-CM | POA: Diagnosis not present

## 2022-02-06 DIAGNOSIS — J45909 Unspecified asthma, uncomplicated: Secondary | ICD-10-CM | POA: Insufficient documentation

## 2022-02-06 DIAGNOSIS — R000 Tachycardia, unspecified: Secondary | ICD-10-CM | POA: Diagnosis not present

## 2022-02-06 DIAGNOSIS — R002 Palpitations: Secondary | ICD-10-CM

## 2022-02-06 DIAGNOSIS — Z8679 Personal history of other diseases of the circulatory system: Secondary | ICD-10-CM | POA: Insufficient documentation

## 2022-02-06 DIAGNOSIS — G4733 Obstructive sleep apnea (adult) (pediatric): Secondary | ICD-10-CM | POA: Diagnosis not present

## 2022-02-06 DIAGNOSIS — I4891 Unspecified atrial fibrillation: Secondary | ICD-10-CM | POA: Diagnosis not present

## 2022-02-06 DIAGNOSIS — J9811 Atelectasis: Secondary | ICD-10-CM | POA: Diagnosis not present

## 2022-02-06 DIAGNOSIS — Z0001 Encounter for general adult medical examination with abnormal findings: Secondary | ICD-10-CM | POA: Diagnosis not present

## 2022-02-06 DIAGNOSIS — Z20822 Contact with and (suspected) exposure to covid-19: Secondary | ICD-10-CM | POA: Insufficient documentation

## 2022-02-06 DIAGNOSIS — I10 Essential (primary) hypertension: Secondary | ICD-10-CM | POA: Insufficient documentation

## 2022-02-06 LAB — TROPONIN I (HIGH SENSITIVITY)
Troponin I (High Sensitivity): 12 ng/L (ref <18)
Troponin I (High Sensitivity): 12 ng/L (ref <18)

## 2022-02-06 LAB — COMPREHENSIVE METABOLIC PANEL
ALT: 21 U/L (ref 0–44)
AST: 22 U/L (ref 15–41)
Albumin: 3.7 g/dL (ref 3.5–5.0)
Alkaline Phosphatase: 72 U/L (ref 38–126)
Anion gap: 10 (ref 5–15)
BUN: 7 mg/dL — ABNORMAL LOW (ref 8–23)
CO2: 18 mmol/L — ABNORMAL LOW (ref 22–32)
Calcium: 8.7 mg/dL — ABNORMAL LOW (ref 8.9–10.3)
Chloride: 111 mmol/L (ref 98–111)
Creatinine, Ser: 0.77 mg/dL (ref 0.61–1.24)
GFR, Estimated: >60 mL/min (ref >60)
Glucose, Bld: 116 mg/dL — ABNORMAL HIGH (ref 70–99)
Potassium: 3.2 mmol/L — ABNORMAL LOW (ref 3.5–5.1)
Sodium: 139 mmol/L (ref 135–145)
Total Bilirubin: 1.5 mg/dL — ABNORMAL HIGH (ref 0.3–1.2)
Total Protein: 6.6 g/dL (ref 6.5–8.1)

## 2022-02-06 LAB — RESP PANEL BY RT-PCR (FLU A&B, COVID) ARPGX2
Influenza A by PCR: NEGATIVE
Influenza B by PCR: NEGATIVE
SARS Coronavirus 2 by RT PCR: NEGATIVE

## 2022-02-06 LAB — CBC
HCT: 44.6 % (ref 39.0–52.0)
Hemoglobin: 15.3 g/dL (ref 13.0–17.0)
MCH: 31.2 pg (ref 26.0–34.0)
MCHC: 34.3 g/dL (ref 30.0–36.0)
MCV: 91 fL (ref 80.0–100.0)
Platelets: 178 10*3/uL (ref 150–400)
RBC: 4.9 MIL/uL (ref 4.22–5.81)
RDW: 14.5 % (ref 11.5–15.5)
WBC: 9.3 10*3/uL (ref 4.0–10.5)
nRBC: 0 % (ref 0.0–0.2)

## 2022-02-06 LAB — PROTIME-INR
INR: 1.2 (ref 0.8–1.2)
Prothrombin Time: 15 seconds (ref 11.4–15.2)

## 2022-02-06 LAB — TSH: TSH: 1.09 u[IU]/mL (ref 0.350–4.500)

## 2022-02-06 LAB — BRAIN NATRIURETIC PEPTIDE: B Natriuretic Peptide: 212.6 pg/mL — ABNORMAL HIGH (ref 0.0–100.0)

## 2022-02-06 MED ORDER — ATORVASTATIN CALCIUM 10 MG PO TABS
5.0000 mg | ORAL_TABLET | Freq: Every day | ORAL | Status: DC
  Administered 2022-02-06 – 2022-02-09 (×4): 5 mg via ORAL
  Filled 2022-02-06 (×4): qty 1

## 2022-02-06 MED ORDER — DILTIAZEM HCL 60 MG PO TABS
60.0000 mg | ORAL_TABLET | ORAL | Status: AC
  Administered 2022-02-06: 60 mg via ORAL
  Filled 2022-02-06: qty 1

## 2022-02-06 MED ORDER — POTASSIUM CHLORIDE 20 MEQ PO PACK
40.0000 meq | PACK | Freq: Two times a day (BID) | ORAL | Status: DC
  Administered 2022-02-06 – 2022-02-08 (×6): 40 meq via ORAL
  Filled 2022-02-06 (×7): qty 2

## 2022-02-06 MED ORDER — ACETAMINOPHEN 325 MG PO TABS: 650.0000 mg | ORAL_TABLET | ORAL | Status: DC | PRN

## 2022-02-06 MED ORDER — APIXABAN 5 MG PO TABS
5.0000 mg | ORAL_TABLET | Freq: Two times a day (BID) | ORAL | Status: DC
  Administered 2022-02-06 – 2022-02-09 (×6): 5 mg via ORAL
  Filled 2022-02-06 (×6): qty 1

## 2022-02-06 MED ORDER — METOPROLOL TARTRATE 5 MG/5ML IV SOLN: 2.5000 mg | INTRAVENOUS | Status: AC

## 2022-02-06 MED ORDER — DILTIAZEM HCL 60 MG PO TABS
60.0000 mg | ORAL_TABLET | Freq: Four times a day (QID) | ORAL | Status: DC
  Administered 2022-02-06 – 2022-02-07 (×4): 60 mg via ORAL
  Filled 2022-02-06 (×6): qty 1

## 2022-02-06 MED ORDER — METOPROLOL SUCCINATE ER 100 MG PO TB24
200.0000 mg | ORAL_TABLET | Freq: Every day | ORAL | Status: DC
Start: 2022-02-07 — End: 2022-02-09
  Administered 2022-02-07 – 2022-02-09 (×3): 200 mg via ORAL
  Filled 2022-02-06 (×3): qty 2

## 2022-02-06 MED ORDER — METOPROLOL TARTRATE 25 MG PO TABS: 25.0000 mg | ORAL_TABLET | Freq: Two times a day (BID) | ORAL | Status: DC

## 2022-02-06 MED ORDER — POTASSIUM CHLORIDE ER 10 MEQ PO TBCR
40.0000 meq | EXTENDED_RELEASE_TABLET | Freq: Once | ORAL | Status: AC
  Administered 2022-02-06: 40 meq via ORAL
  Filled 2022-02-06: qty 4

## 2022-02-06 MED ORDER — DILTIAZEM HCL 25 MG/5ML IV SOLN
10.0000 mg | Freq: Once | INTRAVENOUS | Status: AC
  Administered 2022-02-06: 10 mg via INTRAVENOUS
  Filled 2022-02-06: qty 5

## 2022-02-06 MED ORDER — IRBESARTAN 150 MG PO TABS
150.0000 mg | ORAL_TABLET | Freq: Every day | ORAL | Status: DC
  Administered 2022-02-07 – 2022-02-08 (×2): 150 mg via ORAL
  Filled 2022-02-06 (×2): qty 1

## 2022-02-06 MED ORDER — ONDANSETRON HCL 4 MG/2ML IJ SOLN: 4.0000 mg | Freq: Four times a day (QID) | INTRAMUSCULAR | Status: DC | PRN

## 2022-02-06 NOTE — H&P (Addendum)
CARDIOLOGY ADMIT NOTE   Patient ID: Leslie Soto MRN: [id number] DOB/AGE: 1961-09-26 61 y.o.  Admit date: 02/06/2022 Primary Physician:  Deland Pretty, MD  Patient ID: Leslie Soto, male    DOB: January 22, 1961, 61 y.o.   MRN: [id number]  Chief Complaint  Patient presents with   Tachycardia   HPI:    Leslie Soto  is a 61 y.o. with past medical history of morbid obesity, hypertension, atrial bigeminy noted on EKG during colonoscopy, obstructive sleep apnea on CPAP therapy and has had recurrent episodes of nephrolithiasis. He has also been noted to have right bundle branch block since January 2020.    He had been doing well until 2 to 3 days ago started noticing palpitations.  He also noticed that he was slightly tired more than usual but denied any worsening dyspnea, PND or orthopnea.  Denied chest pain.  His brother is present at the bedside.  Today he went for routine physical examination with Dr. Shelia Media and was found to be in a flutter with RVR hence transferred to [facility] emergency room.  Patient states that he has started to feel better, still heart rate is up-and-down.  Past Medical History:  Diagnosis Date   Abnormal EKG    hx right bundle branck block on ekg   Asthma    cold weather induced   Bronchial asthma 02/18/2019   Degenerative disc disease    ?   Gastric ulcer with hemorrhage 08/28/2012   GI bleed due to NSAIDs    History of kidney stones    Hypertension    Laboratory examination 02/18/2019   Lower back pain    Obesity (BMI 35.0-39.9 without comorbidity)    OSA (obstructive sleep apnea)    Dr. Vertell Limber, [place]-uses cpap at night  set on 10-8   Palpitations 02/18/2019   Pneumonia 8 years ago   walking pneumonia    Right bundle branch block (RBBB) 02/18/2019   Shortness of breath 02/18/2019   Spina bifida ([place])    L 2 or L 3 defect creating left leg pain    Past Surgical History:  Procedure Laterality Date   CARDIOVASCULAR STRESS TEST  06/12/2012   Normal  pattern of perfusion in all regions, no ECG changes, EKG negative for ischemia.   COLONOSCOPY WITH PROPOFOL N/A 12/07/2017   Procedure: COLONOSCOPY WITH PROPOFOL;  Surgeon: Carol Ada, MD;  Location: [facility];  Service: Endoscopy;  Laterality: N/A;   CYSTOSCOPY/URETEROSCOPY/HOLMIUM LASER/STENT PLACEMENT Bilateral 01/31/2017   Procedure: CYSTOSCOPY/BILATERAL RETROGRADE PYELOGRAM/ LEFT URETEROSCOPYLEFT /HOLMIUM LASER/BILATERAL STENT PLACEMENT;  Surgeon: Alexis Frock, MD;  Location: [facility];  Service: Urology;  Laterality: Bilateral;   CYSTOSCOPY/URETEROSCOPY/HOLMIUM LASER/STENT PLACEMENT Bilateral 02/21/2017   Procedure: CYSTOSCOPY/URETEROSCOPY/HOLMIUM LASER/STENT REPLACEMENT BILATERAL RETROGRADE;  Surgeon: Alexis Frock, MD;  Location: [facility];  Service: Urology;  Laterality: Bilateral;   ESOPHAGOGASTRODUODENOSCOPY  08/28/2012   Procedure: ESOPHAGOGASTRODUODENOSCOPY (EGD);  Surgeon: Inda Castle, MD;  Location: [facility];  Service: Endoscopy;  Laterality: N/A;   ESOPHAGOGASTRODUODENOSCOPY (EGD) WITH PROPOFOL N/A 12/07/2017   Procedure: ESOPHAGOGASTRODUODENOSCOPY (EGD) WITH PROPOFOL;  Surgeon: Carol Ada, MD;  [facility]: [facility];  Service: Endoscopy;  Laterality: N/A;   EUS N/A 01/04/2018   Procedure: UPPER ENDOSCOPIC ULTRASOUND (EUS) LINEAR;  Surgeon: Carol Ada, MD;  Location: [facility];  Service: Endoscopy;  Laterality: N/A;   EXTRACORPOREAL SHOCK WAVE LITHOTRIPSY     2003   EYE SURGERY Bilateral    1969 weak eye muscle   FINE NEEDLE ASPIRATION N/A 01/04/2018   Procedure:  FINE NEEDLE ASPIRATION (FNA) LINEAR;  Surgeon: Carol Ada, MD;  [facility]: [facility];  Service: Endoscopy;  Laterality: N/A;   HERNIA REPAIR  09/20/1983   87, 86   HERNIA REPAIR  09/25/1997   KNEE ARTHROSCOPY Left 01/20/2011   TRANSTHORACIC ECHOCARDIOGRAM  04/03/2013   EF 55-60%, moderate concentric hypertrophy,    Social History   Socioeconomic History   Marital status: Widowed    Spouse  name: Not on file   Number of children: 0   Years of education: Not on file   Highest education level: Not on file  Occupational History   Not on file  Tobacco Use   Smoking status: Never   Smokeless tobacco: Never  Vaping Use   Vaping Use: Never used  Substance and Sexual Activity   Alcohol use: No   Drug use: No   Sexual activity: Never  Other Topics Concern   Not on file  Social History Narrative   Not on file   Social Determinants of Health   Financial Resource Strain: Not on file  Food Insecurity: Not on file  Transportation Needs: Not on file  Physical Activity: Not on file  Stress: Not on file  Social Connections: Not on file  Intimate Partner Violence: Not on file   Family History  Problem Relation Age of Onset   Mental illness Father    Heart disease Father    Diabetes Father    Hypertension Father    Stroke Mother    Hypertension Mother    Hypertension Brother    Prostate cancer Brother    Prostate cancer Brother     ROS  Review of Systems  Cardiovascular:  Positive for dyspnea on exertion and palpitations. Negative for chest pain and leg swelling.  Objective   Vitals with BMI 02/06/2022 02/06/2022 02/06/2022  Height - - -  Weight - - -  BMI - - -  Systolic 244 010 272  Diastolic 72 78 76  Pulse 536 108 114      Physical Exam Neck:     Vascular: No JVD.  Cardiovascular:     Rate and Rhythm: Tachycardia present. Rhythm irregular.     Pulses: Normal pulses and intact distal pulses.     Heart sounds: No murmur heard.   No gallop. No S3 or S4 sounds.  Pulmonary:     Effort: Pulmonary effort is normal.     Breath sounds: Normal breath sounds.  Abdominal:     General: Bowel sounds are normal.     Palpations: Abdomen is soft.  Musculoskeletal:     Right lower leg: No edema.     Left lower leg: No edema.  Skin:    Capillary Refill: Capillary refill takes less than 2 seconds.   Laboratory examination:   Recent Labs    11/05/21 0742  02/06/22 1210  NA 135 139  K 3.6 3.2*  CL 107 111  CO2 18* 18*  GLUCOSE 159* 116*  Soto 9 7*  CREATININE 0.83 0.77  CALCIUM 8.8* 8.7*  GFRNONAA >60 >60   CrCl cannot be calculated (Unknown ideal weight.).  CMP Latest Ref Rng & Units 02/06/2022 11/05/2021 02/21/2017  Glucose 70 - 99 mg/dL 116(H) 159(H) 101(H)  Soto 8 - 23 mg/dL 7(L) 9 17  Creatinine 0.61 - 1.24 mg/dL 0.77 0.83 0.97  Sodium 135 - 145 mmol/L 139 135 138  Potassium 3.5 - 5.1 mmol/L 3.2(L) 3.6 4.1  Chloride 98 - 111 mmol/L 111 107 108  CO2 22 -  32 mmol/L 18(L) 18(L) 22  Calcium 8.9 - 10.3 mg/dL 8.7(L) 8.8(L) 8.9  Total Protein 6.5 - 8.1 g/dL 6.6 - -  Total Bilirubin 0.3 - 1.2 mg/dL 1.5(H) - -  Alkaline Phos 38 - 126 U/L 72 - -  AST 15 - 41 U/L 22 - -  ALT 0 - 44 U/L 21 - -   CBC Latest Ref Rng & Units 02/06/2022 11/05/2021 02/21/2017  WBC 4.0 - 10.5 K/uL 9.3 11.5(H) 12.4(H)  Hemoglobin 13.0 - 17.0 g/dL 15.3 16.9 15.0  Hematocrit 39.0 - 52.0 % 44.6 49.8 43.3  Platelets 150 - 400 K/uL 178 187 221   Lipid Panel  No results found for: CHOL, TRIG, HDL, CHOLHDL, VLDL, LDLCALC, LDLDIRECT HEMOGLOBIN A1C No results found for: HGBA1C, MPG TSH No results for input(s): TSH in the last 8760 hours. BNP (last 3 results) No results for input(s): BNP in the last 8760 hours. Cardiac Panel (last 3 results) Recent Labs    02/06/22 1210 02/06/22 1618  TROPONINIHS 12 12     Medications and allergies  No Known Allergies   Prior to Admission medications   Medication Sig Start Date End Date Taking? Authorizing Provider  acetaminophen (TYLENOL) 500 MG tablet Take 1,000 mg by mouth every 8 (eight) hours as needed for mild pain.   Yes [provider]  amLODipine (NORVASC) 10 MG tablet Take 10 mg by mouth at bedtime.    Yes [provider]  atorvastatin (LIPITOR) 10 MG tablet Take 1 tablet (10 mg total) by mouth daily. Patient taking differently: Take 5 mg by mouth daily. 04/28/21 04/23/22 Yes Adrian Prows, MD   Glucos-MSM-C-Mn-Ginger-Willow (GLUCOSAMINE MSM COMPLEX) TABS Take 1 tablet by mouth daily.   Yes [provider]  hydrALAZINE (APRESOLINE) 50 MG tablet Take 1 tablet (50 mg total) by mouth 3 (three) times daily as needed (BP >150/90 mm Hg.). 04/28/21 03/25/22 Yes Adrian Prows, MD  Magnesium 500 MG CAPS Take 500 mg by mouth every other day.   Yes [provider]  metoprolol succinate (TOPROL-XL) 25 MG 24 hr tablet Take 25 mg by mouth in the morning and at bedtime.   Yes [provider]  Turmeric-Ginger 150-25 MG CHEW Chew 1 capsule by mouth daily.   Yes [provider]  valsartan-hydrochlorothiazide (DIOVAN-HCT) 320-12.5 MG tablet Take 1 tablet by mouth daily.   Yes [provider]  metoprolol (TOPROL-XL) 200 MG 24 hr tablet TAKE 1 TABLET BY MOUTH DAILY WITH FOOD Patient not taking: Reported on 02/06/2022 04/28/21   Adrian Prows, MD  ondansetron (ZOFRAN ODT) 4 MG disintegrating tablet Take 1 tablet (4 mg total) by mouth every 8 (eight) hours as needed for nausea or vomiting. Patient not taking: Reported on 02/06/2022 11/05/21   Johnn Hai, PA-C  oxyCODONE-acetaminophen (PERCOCET) 7.5-325 MG tablet Take 1 tablet by mouth every 6 (six) hours as needed for severe pain. Patient not taking: Reported on 02/06/2022 11/05/21 11/05/22  Johnn Hai, PA-C  tamsulosin (FLOMAX) 0.4 MG CAPS capsule Take 1 capsule (0.4 mg total) by mouth daily. Patient not taking: Reported on 02/06/2022 11/05/21   Johnn Hai, PA-C      Current Outpatient Medications  Medication Instructions   acetaminophen (TYLENOL) 1,000 mg, Oral, Every 8 hours PRN   amLODipine (NORVASC) 10 mg, Oral, Daily at bedtime   atorvastatin (LIPITOR) 10 mg, Oral, Daily   Glucos-MSM-C-Mn-Ginger-Willow (GLUCOSAMINE MSM COMPLEX) TABS 1 tablet, Oral, Daily   hydrALAZINE (APRESOLINE) 50 mg, Oral, 3 times daily PRN  Magnesium 500 mg, Oral, Every other day   metoprolol (TOPROL-XL) 200 MG 24 hr  tablet TAKE 1 TABLET BY MOUTH DAILY WITH FOOD   metoprolol succinate (TOPROL-XL) 25 mg, Oral, 2 times daily   ondansetron (ZOFRAN ODT) 4 mg, Oral, Every 8 hours PRN   oxyCODONE-acetaminophen (PERCOCET) 7.5-325 MG tablet 1 tablet, Oral, Every 6 hours PRN   tamsulosin (FLOMAX) 0.4 mg, Oral, Daily   Turmeric-Ginger 150-25 MG CHEW 1 capsule, Oral, Daily   valsartan-hydrochlorothiazide (DIOVAN-HCT) 320-12.5 MG tablet 1 tablet, Oral, Daily    No intake/output data recorded. No intake/output data recorded.   Radiology:  [facility] Chest Port 1 View  Result Date: 02/06/2022 CLINICAL DATA:  Tachycardia EXAM: PORTABLE CHEST 1 VIEW COMPARISON:  CT examination dated November 05, 2021 FINDINGS: The heart size and mediastinal contours are within normal limits. Low lung volumes with left basilar atelectasis. No focal consolidation or large pleural effusion. The visualized skeletal structures are unremarkable. IMPRESSION: Low lung volumes with left basilar atelectasis. No focal consolidation or large pleural effusion. Electronically Signed   By: Keane Police D.O.   On: 02/06/2022 12:25     Cardiac Studies:   Sleep Study 2012: Severe Obstructive Sleep apnea: On CPAP and compliant.  External Pharmacological Stress Test 06/12/2012: ECG Conclusions: Within normal limits. Normal pattern of perfusion in all myocardial regions.  Lexiscan (Walking with Jaci Carrel) Sestamibi Stress Test 04/12/2020: Lexiscan stress. Patient achieved THR with mod Bruce protocol. No ST-T changes of ischemia. The heart rate response was accelerated. The baseline blood pressure was 198/110 mmHg. Maximum blood pressure post injection was 238/118 mmHg. Hypertensive throughout.  Myocardial perfusion is normal. Overall LV systolic function is abnormal without regional wall motion abnormalities. Stress LV EF: 44%.  Findings suggest non ischemic cardiomyopathy.  No previous exam available for comparison.  Intermediate risk.  Echocardiogram  04/20/2020:  Normal LV systolic function with visual EF 50-55%. Left ventricle cavity is normal in size. Mild to Moderate left ventricular hypertrophy. Normal  global wall motion. Indeterminate diastolic filling pattern, elevated LAP.  Moderate LA enlargement at 4.7 cm. Mild (Grade I) mitral regurgitation.  Mild tricuspid regurgitation.  The aortic root is mildly dilated at 3.8 cm. Proximal ascending aorta not well visualized.  Compared to prior study dated 04/04/2017 Mild MR and TR are new, aortic dilatation is new.   EKG   EKG 04/28/2021: Normal sinus rhythm at rate of 64 bpm, left axis deviation, left anterior fascicular block.  Right bundle branch block.  Bifascicular block.  Low-voltage complexes.  Pulmonary disease pattern.   Assessment  Leslie Soto  is a 61 y.o. with past medical history of morbid obesity, hypertension, atrial bigeminy noted on EKG during colonoscopy, obstructive sleep apnea on CPAP therapy and has had recurrent episodes of nephrolithiasis. He has also been noted to have right bundle branch block since January 2020.  He had been doing well until 2 to 3 days ago started noticing palpitations.  Found to have new onset typical atrial flutter with RVR. 1.  New onset atrial flutter with variable AV conduction and rapid ventricular response.  Underlying right bundle branch block. 2.  Primary hypertension 3.  OSA on CPAP 4.  Morbid obesity  Recommendations:  Patient's symptoms started about 2 to 3 days ago with palpitations and mild fatigue.  Suspect he may have gone into typical atrial flutter at the time.  Heart rate is still elevated, will admit the patient for heart rate control and anticoagulation.  We will see if  I can do TEE guided cardioversion tomorrow depending upon the schedule.  Blood pressure is well controlled on present medical regimen, continue the same for now.  He has very mild hyperlipidemia, he has had complete physical exam and told to have normal  labs with Dr. Deland Pretty.  We will try to obtain these reports.  Patient's brother is at the bedside.  All questions answered.  Adrian Prows, MD, [facility] 02/06/2022, 5:43 PM [facility]. PA Pager: [phone] Office: [phone]

## 2022-02-06 NOTE — ED Triage Notes (Signed)
Pt arrives via ems. Pt was having a physical with his pcp today and he was very tachycardic. Pt denies any associated symptoms. Ems gave 2 doses of Cardizem 20mg  and 10mg  via IV.

## 2022-02-06 NOTE — ED Provider Notes (Signed)
[facility] EMERGENCY DEPARTMENT Provider Note   CSN: [id number] Arrival date & time: 02/06/22  1133     History  Chief Complaint  Patient presents with   Tachycardia    Leslie Soto is a 61 y.o. male.  HPI 61 year old male brought in by EMS from tachycardia.  Patient was seen in his primary care office for regular checkup today.  He has not had any symptoms.  He was noted to be tachycardic to 150.  EMS was called.  On their rhythm strips he was in a tachycardia of about 150.  He was given Cardizem.  Rate slowed down and appears to show an underlying atrial flutter.  Patient denies any chest pain, lightheadedness, nausea, vomiting, or dyspnea.  He has not noted any new symptoms.  He denies any similar symptoms or episodes in the past.  He has seen Dr. Einar Gip in the past     Home Medications Prior to Admission medications   Medication Sig Start Date End Date Taking? Authorizing Provider  acetaminophen (TYLENOL) 500 MG tablet Take 1,000 mg by mouth every 8 (eight) hours as needed for mild pain.   Yes [provider]  amLODipine (NORVASC) 10 MG tablet Take 10 mg by mouth at bedtime.    Yes [provider]  atorvastatin (LIPITOR) 10 MG tablet Take 1 tablet (10 mg total) by mouth daily. Patient taking differently: Take 5 mg by mouth daily. 04/28/21 04/23/22 Yes Adrian Prows, MD  Glucos-MSM-C-Mn-Ginger-Willow (GLUCOSAMINE MSM COMPLEX) TABS Take 1 tablet by mouth daily.   Yes [provider]  hydrALAZINE (APRESOLINE) 50 MG tablet Take 1 tablet (50 mg total) by mouth 3 (three) times daily as needed (BP >150/90 mm Hg.). 04/28/21 03/25/22 Yes Adrian Prows, MD  Magnesium 500 MG CAPS Take 500 mg by mouth every other day.   Yes [provider]  Turmeric-Ginger 150-25 MG CHEW Chew 1 capsule by mouth daily.   Yes [provider]  valsartan-hydrochlorothiazide (DIOVAN-HCT) 320-12.5 MG tablet Take 1 tablet by mouth daily.   Yes [provider]  metoprolol (TOPROL-XL) 200 MG 24 hr tablet TAKE 1 TABLET BY MOUTH DAILY WITH FOOD Patient not taking: Reported on 02/06/2022 04/28/21   Adrian Prows, MD      Allergies    Patient has no known allergies.    Review of Systems   Review of Systems  Constitutional: Negative.   Respiratory: Negative.    Cardiovascular: Negative.    Physical Exam Updated Vital Signs BP 128/83    Pulse 96    Temp 98.6 F (37 C) (Oral)    Resp 17    SpO2 96%  Physical Exam Vitals and nursing note reviewed.  Constitutional:      Appearance: Normal appearance. He is obese.  HENT:     Head: Normocephalic.     Right Ear: External ear normal.     Left Ear: External ear normal.     Nose: Nose normal.     Mouth/Throat:     Pharynx: Oropharynx is clear.  Eyes:     Pupils: Pupils are equal, round, and reactive to light.  Cardiovascular:     Rate and Rhythm: Regular rhythm. Tachycardia present.  Pulmonary:     Effort: Pulmonary effort is normal.     Breath sounds: Normal breath sounds.  Abdominal:     General: Abdomen is flat.     Palpations: Abdomen is soft.  Musculoskeletal:        General:  Normal range of motion.     Cervical back: Normal range of motion.  Skin:    General: Skin is warm.     Capillary Refill: Capillary refill takes less than 2 seconds.  Neurological:     General: No focal deficit present.     Mental Status: He is alert.  Psychiatric:        Behavior: Behavior normal.    ED Results / Procedures / Treatments   Labs (all labs ordered are listed, but only abnormal results are displayed) Labs Reviewed  COMPREHENSIVE METABOLIC PANEL - Abnormal; Notable for the following components:      Result Value   Potassium 3.2 (*)    CO2 18 (*)    Glucose, Bld 116 (*)    BUN 7 (*)    Calcium 8.7 (*)    Total Bilirubin 1.5 (*)    All other components within normal limits  RESP PANEL BY RT-PCR (FLU A&B, COVID) ARPGX2  CBC  PROTIME-INR  TSH  HIV ANTIBODY (ROUTINE TESTING W  REFLEX)  BRAIN NATRIURETIC PEPTIDE  BASIC METABOLIC PANEL  TROPONIN I (HIGH SENSITIVITY)  TROPONIN I (HIGH SENSITIVITY)    EKG EKG Interpretation  Date/Time:  Monday February 06 2022 11:52:53 EST Ventricular Rate:  145 PR Interval:    QRS Duration: 151 QT Interval:  356 QTC Calculation: 553 R Axis:   -39 Text Interpretation: Atrial flutter with 2 to 1 block Right bundle branch block Inferior infarct, old No significant QRS morphology change since last tracing from January 30, 2017 Confirmed by Pattricia Boss [phone]) on 02/06/2022 1:30:58 PM  Radiology DG Chest Port 1 View  Result Date: 02/06/2022 CLINICAL DATA:  Tachycardia EXAM: PORTABLE CHEST 1 VIEW COMPARISON:  CT examination dated November 05, 2021 FINDINGS: The heart size and mediastinal contours are within normal limits. Low lung volumes with left basilar atelectasis. No focal consolidation or large pleural effusion. The visualized skeletal structures are unremarkable. IMPRESSION: Low lung volumes with left basilar atelectasis. No focal consolidation or large pleural effusion. Electronically Signed   By: Keane Police D.O.   On: 02/06/2022 12:25    Procedures Procedures    Medications Ordered in ED Medications  potassium chloride (KLOR-CON) packet 40 mEq (40 mEq Oral Given 02/06/22 1344)  acetaminophen (TYLENOL) tablet 650 mg (has no administration in time range)  ondansetron (ZOFRAN) injection 4 mg (has no administration in time range)  metoprolol tartrate (LOPRESSOR) injection 2.5 mg (has no administration in time range)  metoprolol tartrate (LOPRESSOR) tablet 25 mg (has no administration in time range)  diltiazem (CARDIZEM) tablet 60 mg (has no administration in time range)  apixaban (ELIQUIS) tablet 5 mg (has no administration in time range)  potassium chloride (KLOR-CON) CR tablet 40 mEq (has no administration in time range)  diltiazem (CARDIZEM) tablet 60 mg (60 mg Oral Given 02/06/22 1400)  diltiazem (CARDIZEM)  injection 10 mg (10 mg Intravenous Given 02/06/22 1343)    ED Course/ Medical Decision Making/ A&P Clinical Course as of 02/06/22 1904  Mon Feb 06, 2022  1331 CBC CBC reviewed interpreted within normal [DR]  1331 Comprehensive metabolic panel(!) C-Met reviewed and interpreted with mild hypokalemia at 3.2 and hypocalcemia at 8.7 CO2 decreased at 18 Glucose normal at 116 [DR]    Clinical Course User Index [DR] Pattricia Boss, MD                           Medical Decision Making 61 year old male presents  with new onset of atrial flutter with 2 1 block.  He is asymptomatic.  Patient is unable to add to history to interpret how long patient has had flutter in place.  He reports being completely asymptomatic and this was picked up on a routine physical exam visit to his primary care physician. °He was given Cardizem prehospital with some decrease in his rate and we were only able to see the flutter during that time. °Patient has remained hemodynamically stable here in the ED with the exception of the tachycardia. °He has been given p.o. and IV Cardizem °He is mildly hypokalemic and this will be repleted to optimize cardiac conditions ° °Amount and/or Complexity of Data Reviewed °Independent Historian: EMS °   Details: EMS reports that they were called for the tachycardia.  I reviewed several prehospital tracings including 1 after received Cardizem that is significant for atrial flutter °External Data Reviewed: ECG. °Labs: ordered. Decision-making details documented in ED Course. °Radiology: ordered. Decision-making details documented in ED Course. °Discussion of management or test interpretation with external provider(s): Care discussed with Dr. Ganji.  He will see in department. °Cardizem 10 mg given IV and 60 mg p.o. °Patient continues heart rate of 143 on monitor with normal blood pressure, normal sats, and normal dyspnea.  He has no complaints at this time. ° ° °Risk °Prescription drug  management. °Drug therapy requiring intensive monitoring for toxicity. °Decision regarding hospitalization. °Risk Details: Patient presents with atrial flutter at rate of 147 with 2:1 block.  He had prehospital iv cardizem and required repeat iv rate control medications.  Patient at risk for cardiovascular decompensation due to rapid heart rate.  Discussed with Dr. Ganji, cardiology and he will evaluate at bedside. °Care discussed with next ED attending, Dr. Schlossman, due to patient's ongoing high rate while awaiting consultation. ° ° °Critical Care °Total time providing critical care: < 30 minutes ° ° ° ° ° ° ° ° ° °Final Clinical Impression(s) / ED Diagnoses °Final diagnoses:  °Atrial flutter, unspecified type (HCC)  °Tachycardia  ° ° °Rx / DC Orders °ED Discharge Orders   ° ° None  ° °  ° ° °  °Ray, Danielle, MD °02/06/22 1904 ° °

## 2022-02-07 ENCOUNTER — Observation Stay (HOSPITAL_COMMUNITY): Payer: BC Managed Care – PPO | Admitting: Certified Registered Nurse Anesthetist

## 2022-02-07 ENCOUNTER — Encounter (HOSPITAL_COMMUNITY): Payer: Self-pay | Admitting: Cardiology

## 2022-02-07 ENCOUNTER — Observation Stay (HOSPITAL_COMMUNITY): Payer: BC Managed Care – PPO

## 2022-02-07 ENCOUNTER — Encounter (HOSPITAL_COMMUNITY)
Admission: EM | Disposition: A | Discharge status: Home / Self Care | Payer: Self-pay | Source: Home / Self Care | Attending: Emergency Medicine

## 2022-02-07 ENCOUNTER — Other Ambulatory Visit (HOSPITAL_COMMUNITY): Payer: Self-pay

## 2022-02-07 DIAGNOSIS — I4892 Unspecified atrial flutter: Secondary | ICD-10-CM | POA: Diagnosis not present

## 2022-02-07 DIAGNOSIS — I480 Paroxysmal atrial fibrillation: Secondary | ICD-10-CM | POA: Diagnosis not present

## 2022-02-07 DIAGNOSIS — J45909 Unspecified asthma, uncomplicated: Secondary | ICD-10-CM | POA: Diagnosis not present

## 2022-02-07 DIAGNOSIS — Z20822 Contact with and (suspected) exposure to covid-19: Secondary | ICD-10-CM | POA: Diagnosis not present

## 2022-02-07 DIAGNOSIS — I4891 Unspecified atrial fibrillation: Secondary | ICD-10-CM | POA: Diagnosis not present

## 2022-02-07 DIAGNOSIS — G4733 Obstructive sleep apnea (adult) (pediatric): Secondary | ICD-10-CM | POA: Diagnosis not present

## 2022-02-07 DIAGNOSIS — I10 Essential (primary) hypertension: Secondary | ICD-10-CM | POA: Diagnosis not present

## 2022-02-07 DIAGNOSIS — I081 Rheumatic disorders of both mitral and tricuspid valves: Secondary | ICD-10-CM | POA: Diagnosis not present

## 2022-02-07 HISTORY — PX: TEE WITHOUT CARDIOVERSION: SHX5443

## 2022-02-07 HISTORY — PX: CARDIOVERSION: SHX1299

## 2022-02-07 LAB — BASIC METABOLIC PANEL
Anion gap: 8 (ref 5–15)
BUN: 10 mg/dL (ref 8–23)
CO2: 21 mmol/L — ABNORMAL LOW (ref 22–32)
Calcium: 8.7 mg/dL — ABNORMAL LOW (ref 8.9–10.3)
Chloride: 109 mmol/L (ref 98–111)
Creatinine, Ser: 0.85 mg/dL (ref 0.61–1.24)
GFR, Estimated: >60 mL/min (ref >60)
Glucose, Bld: 110 mg/dL — ABNORMAL HIGH (ref 70–99)
Potassium: 3.8 mmol/L (ref 3.5–5.1)
Sodium: 138 mmol/L (ref 135–145)

## 2022-02-07 LAB — HIV ANTIBODY (ROUTINE TESTING W REFLEX): HIV Screen 4th Generation wRfx: NONREACTIVE

## 2022-02-07 SURGERY — CARDIOVERSION
Anesthesia: General

## 2022-02-07 MED ORDER — EPHEDRINE SULFATE-NACL 50-0.9 MG/10ML-% IV SOSY
PREFILLED_SYRINGE | INTRAVENOUS | Status: DC | PRN
  Administered 2022-02-07: 5 mg via INTRAVENOUS

## 2022-02-07 MED ORDER — SODIUM CHLORIDE 0.9 % IV SOLN: INTRAVENOUS | Status: DC

## 2022-02-07 MED ORDER — PROPOFOL 10 MG/ML IV BOLUS
INTRAVENOUS | Status: DC | PRN
  Administered 2022-02-07: 30 mg via INTRAVENOUS
  Administered 2022-02-07: 75 ug/kg/min via INTRAVENOUS

## 2022-02-07 MED ORDER — PHENYLEPHRINE HCL-NACL 20-0.9 MG/250ML-% IV SOLN: INTRAVENOUS | Status: DC | PRN

## 2022-02-07 MED ORDER — PHENYLEPHRINE HCL-NACL 20-0.9 MG/250ML-% IV SOLN
INTRAVENOUS | Status: DC | PRN
Start: 2022-02-07 — End: 2022-02-07
  Administered 2022-02-07 (×3): 80 ug via INTRAVENOUS

## 2022-02-07 MED ORDER — LIDOCAINE 2% (20 MG/ML) 5 ML SYRINGE
INTRAMUSCULAR | Status: DC | PRN
  Administered 2022-02-07: 40 mg via INTRAVENOUS

## 2022-02-07 MED ORDER — DILTIAZEM HCL ER COATED BEADS 180 MG PO CP24
180.0000 mg | ORAL_CAPSULE | Freq: Every day | ORAL | Status: DC
Start: 2022-02-07 — End: 2022-02-09
  Administered 2022-02-07 – 2022-02-09 (×3): 180 mg via ORAL
  Filled 2022-02-07 (×3): qty 1

## 2022-02-07 NOTE — CV Procedure (Signed)
Procedure performed: TEE guided direct-current cardioversion 02/07/2022 under deep sedation with propofol: LV: Normal LV size.  Mild global hypokinesis.  EF moderately depressed around 40 to 45%. LA: Mildly enlarged.  Left atrial appendage is very small, there is no thrombus, normal velocity.  Interatrial septum is intact.  No smoke evident in the left atrium. RV: Normal in size and function. Mitral valve: Normal, trace MR. Tricuspid valve: Normal.  Trace TR. Aortic valve: Trileaflet.  Normal.  No AI. Pulm eval: Normal.  Trace PI. Aorta: Normal thoracic aorta.  Under MAC anesthesia, 100 J of electricity was delivered in a synchronized fashion, patient converted from typical atrial flutter to normal sinus rhythm with occasional PACs.  Patient tolerated the procedure well.  No immediate complication.   Adrian Prows, MD, [facility] 02/07/2022, 11:20 AM Office: [phone] Fax: [phone] Pager: [phone]

## 2022-02-07 NOTE — Plan of Care (Signed)

## 2022-02-07 NOTE — Discharge Instructions (Signed)

## 2022-02-07 NOTE — TOC Benefit Eligibility Note (Signed)
Patient Teacher, English as a foreign language completed.    The patient is currently admitted and upon discharge could be taking Eliquis 5 mg.  The current 30 day co-pay is, $550.09 due to a $7,500.00 deductible remaining .   The patient is insured through [employer] [place], [place] Patient Advocate Specialist Accident Patient Advocate Team Direct Number: [phone]  Fax: [phone]

## 2022-02-07 NOTE — Transfer of Care (Signed)
Immediate Anesthesia Transfer of Care Note  Patient: Leslie Soto  Procedure(s) Performed: CARDIOVERSION TRANSESOPHAGEAL ECHOCARDIOGRAM (TEE)  Patient Location: Endoscopy Unit  Anesthesia Type:MAC  Level of Consciousness: awake, alert  and oriented  Airway & Oxygen Therapy: Patient connected to nasal cannula oxygen  Post-op Assessment: Post -op Vital signs reviewed and stable  Post vital signs: stable  Last Vitals:  Vitals Value Taken Time  BP 94/68 02/07/22 1121  Temp 36.6 C 02/07/22 1121  Pulse 62 02/07/22 1123  Resp 14 02/07/22 1123  SpO2 97 % 02/07/22 1123  Vitals shown include unvalidated device data.  Last Pain:  Vitals:   02/07/22 1121  TempSrc: Temporal  PainSc: 0-No pain      Patients Stated Pain Goal: 0 (90/30/14 9969)  Complications: No notable events documented.

## 2022-02-07 NOTE — Progress Notes (Signed)
°   02/06/22 2356  Assess: MEWS Score  Temp 98.7 F (37.1 C)  BP (!) 128/101  Pulse Rate (!) 139  ECG Heart Rate (!) 139  SpO2 97 %  Assess: MEWS Score  MEWS Temp 0  MEWS Systolic 0  MEWS Pulse 3  MEWS RR 0  MEWS LOC 0  MEWS Score 3  MEWS Score Color Yellow  Assess: if the MEWS score is Yellow or Red  Were vital signs taken at a resting state? No  Focused Assessment No change from prior assessment  Early Detection of Sepsis Score *See Row Information* Low  MEWS guidelines implemented *See Row Information* No, vital signs rechecked  Treat  MEWS Interventions Administered scheduled meds/treatments;Escalated (See documentation below)  Take Vital Signs  Increase Vital Sign Frequency  Yellow: Q 2hr X 2 then Q 4hr X 2, if remains yellow, continue Q 4hrs  Escalate  MEWS: Escalate Yellow: discuss with charge nurse/RN and consider discussing with provider and RRT  Notify: Charge Nurse/RN  Name of Charge Nurse/RN Notified Jamal Collin, RN  Date Charge Nurse/RN Notified 02/07/22  Time Charge Nurse/RN Notified 0005  Document  Patient Outcome Stabilized after interventions  Progress note created (see row info) Yes

## 2022-02-07 NOTE — Progress Notes (Signed)
Patient presents in atrial flutter, I will proceed with TEE guided cardioversion.  Discussed with the patient regarding risk benefits of TEE guided cardioversion.  Shortness of breath related to acute on chronic diastolic heart failure with mildly elevated BNP.  Mild bibasilar crackles heard.  Otherwise labs are stable.   Adrian Prows, MD, [facility] 02/07/2022, 9:16 AM Office: [phone] Fax: [phone] Pager: [phone]

## 2022-02-07 NOTE — Anesthesia Procedure Notes (Signed)
Procedure Name: MAC Date/Time: 02/07/2022 11:01 AM Performed by: Lavell Luster, CRNA Pre-anesthesia Checklist: Patient identified, Emergency Drugs available, Suction available, Patient being monitored and Timeout performed Patient Re-evaluated:Patient Re-evaluated prior to induction Oxygen Delivery Method: Nasal cannula Preoxygenation: Pre-oxygenation with 100% oxygen Induction Type: IV induction Placement Confirmation: breath sounds checked- equal and bilateral and positive ETCO2 Dental Injury: Teeth and Oropharynx as per pre-operative assessment

## 2022-02-07 NOTE — Anesthesia Preprocedure Evaluation (Addendum)
Anesthesia Evaluation  Patient identified by MRN, date of birth, ID band Patient awake    Reviewed: Allergy & Precautions, NPO status , Patient's Chart, lab work & pertinent test results, reviewed documented beta blocker date and time   Airway Mallampati: II  TM Distance: >3 FB Neck ROM: Full    Dental  (+) Dental Advisory Given, Missing, Poor Dentition,    Pulmonary asthma , sleep apnea and Continuous Positive Airway Pressure Ventilation ,    Pulmonary exam normal breath sounds clear to auscultation       Cardiovascular hypertension, Pt. on medications and Pt. on home beta blockers (-) angina(-) CAD and (-) Past MI + dysrhythmias (RBBB) Atrial Fibrillation  Rhythm:Irregular Rate:Abnormal     Neuro/Psych negative neurological ROS  negative psych ROS   GI/Hepatic Neg liver ROS, PUD,   Endo/Other  Morbid obesity  Renal/GU negative Renal ROS     Musculoskeletal  (+) Arthritis ,   Abdominal   Peds  Hematology negative hematology ROS (+)   Anesthesia Other Findings Day of surgery medications reviewed with the patient.  Reproductive/Obstetrics                            Anesthesia Physical Anesthesia Plan  ASA: 4  Anesthesia Plan: General   Post-op Pain Management: Minimal or no pain anticipated   Induction: Intravenous  PONV Risk Score and Plan: 2 and TIVA  Airway Management Planned: Natural Airway and Nasal Cannula  Additional Equipment:   Intra-op Plan:   Post-operative Plan:   Informed Consent: I have reviewed the patients History and Physical, chart, labs and discussed the procedure including the risks, benefits and alternatives for the proposed anesthesia with the patient or authorized representative who has indicated his/her understanding and acceptance.     Dental advisory given  Plan Discussed with: CRNA  Anesthesia Plan Comments:         Anesthesia Quick  Evaluation

## 2022-02-07 NOTE — Anesthesia Postprocedure Evaluation (Signed)
Anesthesia Post Note  Patient: Leslie Soto  Procedure(s) Performed: CARDIOVERSION TRANSESOPHAGEAL ECHOCARDIOGRAM (TEE)     Patient location during evaluation: Endoscopy Anesthesia Type: General Level of consciousness: awake and alert Pain management: pain level controlled Vital Signs Assessment: post-procedure vital signs reviewed and stable Respiratory status: spontaneous breathing, nonlabored ventilation, respiratory function stable and patient connected to nasal cannula oxygen Cardiovascular status: blood pressure returned to baseline and stable Postop Assessment: no apparent nausea or vomiting Anesthetic complications: no   No notable events documented.  Last Vitals:  Vitals:   02/07/22 1123 02/07/22 1127  BP: 106/73 98/71  Pulse: 63 74  Resp: 14 17  Temp:    SpO2: 97% 98%    Last Pain:  Vitals:   02/07/22 1127  TempSrc:   PainSc: 0-No pain                 Santa Lighter

## 2022-02-07 NOTE — Progress Notes (Signed)
°  Echocardiogram Echocardiogram Transesophageal has been performed.  [name] 02/07/2022, 11:39 AM

## 2022-02-07 NOTE — Progress Notes (Signed)
°  Echocardiogram 2D Echocardiogram has been performed.  Leslie Soto 02/07/2022, 3:13 PM

## 2022-02-07 NOTE — Interval H&P Note (Signed)
History and Physical Interval Note:  02/07/2022 10:44 AM  Leslie Soto  has presented today for surgery, with the diagnosis of Afib.  The various methods of treatment have been discussed with the patient and family. After consideration of risks, benefits and other options for treatment, the patient has consented to  Procedure(s): CARDIOVERSION (N/A) TRANSESOPHAGEAL ECHOCARDIOGRAM (TEE) (N/A) as a surgical intervention.  The patient's history has been reviewed, patient examined, no change in status, stable for surgery.  I have reviewed the patient's chart and labs.  Questions were answered to the patient's satisfaction.     Adrian Prows

## 2022-02-08 ENCOUNTER — Encounter (HOSPITAL_COMMUNITY): Payer: Self-pay | Admitting: Cardiology

## 2022-02-08 DIAGNOSIS — G4733 Obstructive sleep apnea (adult) (pediatric): Secondary | ICD-10-CM | POA: Diagnosis not present

## 2022-02-08 DIAGNOSIS — I4892 Unspecified atrial flutter: Secondary | ICD-10-CM | POA: Diagnosis not present

## 2022-02-08 DIAGNOSIS — I10 Essential (primary) hypertension: Secondary | ICD-10-CM | POA: Diagnosis not present

## 2022-02-08 LAB — URINALYSIS, ROUTINE W REFLEX MICROSCOPIC

## 2022-02-08 LAB — URINALYSIS, MICROSCOPIC (REFLEX)
Bacteria, UA: NONE SEEN
RBC / HPF: >50 RBC/hpf (ref 0–5)

## 2022-02-08 MED ORDER — DILTIAZEM HCL ER COATED BEADS 180 MG PO CP24: 180.0000 mg | ORAL_CAPSULE | Freq: Every day | ORAL | 2 refills | Status: DC

## 2022-02-08 MED ORDER — APIXABAN 5 MG PO TABS: 5.0000 mg | ORAL_TABLET | Freq: Two times a day (BID) | ORAL | 3 refills | Status: DC

## 2022-02-08 NOTE — Care Management (Signed)
°  Transition of Care [facility]) Screening Note   Patient Details  Name: Leslie Soto Date of Birth: 09/02/1961   Transition of Care [facility]) CM/SW Contact:    Bethena Roys, RN Phone Number: 02/08/2022, 11:04 AM    Transition of Care Department [facility]) has reviewed patient and no TOC needs have been identified at this time. We will continue to monitor patient advancement through interdisciplinary progression rounds. If new patient transition needs arise, please place a TOC consult.

## 2022-02-08 NOTE — Discharge Summary (Addendum)
Physician Discharge Summary  Patient ID: Leslie Soto MRN: [id number] DOB/AGE: 09-03-1961 61 y.o. Deland Pretty, MD   Admit date: 02/06/2022 Discharge date: 02/09/2022  Primary Discharge Diagnosis Difficult atrial flutter with variable ventricular response new onset. Primary hypertension Morbid obesity OSA on CPAP Nephrolithiasis with hematuria, resolved   Significant Diagnostic Studies:  EKG  EKG 02/06/2022 and also 02/07/2022: Typical atrial flutter with variable AV conduction with nonspecific T abnormality.   TEE guided direct-current cardioversion 02/08/2022:  1. Left ventricular ejection fraction, by estimation, is 40 to 45%. Left ventricular ejection fraction by PLAX is 45 %. The left ventricle has mildly decreased function. The left ventricle demonstrates global hypokinesis. Left ventricular diastolic  function could not be evaluated.  2. Right ventricular systolic function is normal. The right ventricular size is normal. There is normal pulmonary artery systolic pressure.  3. Small LAA. No thrombus and normal flow velocity.  4. The mitral valve is normal in structure. Trivial mitral valve regurgitation. No evidence of mitral stenosis.  5. The aortic valve is normal in structure. Aortic valve regurgitation is not visualized. No aortic stenosis is present.  Under MAC anesthesia, 100 J of electricity was delivered in a synchronized fashion, patient converted from typical atrial flutter to normal sinus rhythm with occasional PACs.  Patient tolerated the procedure well.  No immediate complication.    Radiology: [facility] Chest Port 1 View  Result Date: 02/06/2022 CLINICAL DATA:  Tachycardia EXAM: PORTABLE CHEST 1 VIEW COMPARISON:  CT examination dated November 05, 2021 FINDINGS: The heart size and mediastinal contours are within normal limits. Low lung volumes with left basilar atelectasis. No focal consolidation or large pleural effusion. The visualized skeletal structures are  unremarkable. IMPRESSION: Low lung volumes with left basilar atelectasis. No focal consolidation or large pleural effusion. Electronically Signed   By: Keane Police D.O.   On: 02/06/2022 12:25    Hospital Course: Leslie Soto is a 61 y.o. male  patient  with past medical history of morbid obesity, hypertension, atrial bigeminy noted on EKG during colonoscopy, obstructive sleep apnea on CPAP therapy and has had recurrent episodes of nephrolithiasis admitted to the hospital on 02/06/2022 directly from the PCP office due to new onset typical atrial flutter with RVR.  He underwent TEE guided direct-current cardioversion the following day and maintained sinus rhythm.  Was stable for discharge the following day after cardioversion, however she had developed significant hematuria hence kept additional day.  There was no other immediate complications, after observing him for additional 1 day he was discharged home on Eliquis.  Recommendations on discharge: Blood pressure was soft, he was on amlodipine and also metoprolol succinate 200 mg daily, which was changed over to diltiazem CD1 80 mg daily along with reducing the dose of metoprolol succinate 100 mg daily.  Eliquis 5 mg p.o. twice daily.  He will be set up for outpatient visit/TOC visit in 1 week.  Discharge Exam: Vitals with BMI 02/08/2022 02/07/2022 02/07/2022  Height - - -  Weight - - -  BMI - - -  Systolic 638 - 466  Diastolic 77 - 71  Pulse 64 72 70     Physical Exam Constitutional:      Appearance: He is morbidly obese.  Neck:     Vascular: No JVD.  Cardiovascular:     Rate and Rhythm: Normal rate and regular rhythm.     Pulses: Intact distal pulses.     Heart sounds: Normal heart sounds. No murmur heard.  No gallop.  Pulmonary:     Effort: Pulmonary effort is normal.     Breath sounds: Normal breath sounds.  Abdominal:     General: Bowel sounds are normal.     Palpations: Abdomen is soft.  Musculoskeletal:     Right lower leg:  No edema.     Left lower leg: No edema.    Labs:   Lab Results  Component Value Date   WBC 9.3 02/06/2022   HGB 15.3 02/06/2022   HCT 44.6 02/06/2022   MCV 91.0 02/06/2022   PLT 178 02/06/2022    Recent Labs  Lab 02/06/22 1210 02/07/22 0637  NA 139 138  K 3.2* 3.8  CL 111 109  CO2 18* 21*  BUN 7* 10  CREATININE 0.77 0.85  CALCIUM 8.7* 8.7*  PROT 6.6  --   BILITOT 1.5*  --   ALKPHOS 72  --   ALT 21  --   AST 22  --   GLUCOSE 116* 110*    Lipid Panel  No results found for: CHOL, TRIG, HDL, CHOLHDL, VLDL, LDLCALC  BNP (last 3 results) Recent Labs    02/06/22 1739  BNP 212.6*    HEMOGLOBIN A1C No results found for: HGBA1C, MPG  Cardiac Panel (last 3 results) Recent Labs    02/06/22 1210 02/06/22 1618  TROPONINIHS 12 12     TSH Recent Labs    02/06/22 1739  TSH 1.090    FOLLOW UP PLANS AND APPOINTMENTS  Allergies as of 02/08/2022   No Known Allergies      Medication List     STOP taking these medications    amLODipine 10 MG tablet Commonly known as: NORVASC   hydrALAZINE 50 MG tablet Commonly known as: APRESOLINE   metoprolol 200 MG 24 hr tablet Commonly known as: TOPROL-XL       TAKE these medications    acetaminophen 500 MG tablet Commonly known as: TYLENOL Take 1,000 mg by mouth every 8 (eight) hours as needed for mild pain.   apixaban 5 MG Tabs tablet Commonly known as: ELIQUIS Take 1 tablet (5 mg total) by mouth 2 (two) times daily.   atorvastatin 10 MG tablet Commonly known as: LIPITOR Take 1 tablet (10 mg total) by mouth daily. What changed: how much to take   diltiazem 180 MG 24 hr capsule Commonly known as: CARDIZEM CD Take 1 capsule (180 mg total) by mouth daily. Start taking on: February 09, 2022   Glucosamine MSM Complex Tabs Take 1 tablet by mouth daily.   Magnesium 500 MG Caps Take 500 mg by mouth every other day.   Turmeric-Ginger 150-25 MG Chew Chew 1 capsule by mouth daily.    valsartan-hydrochlorothiazide 320-12.5 MG tablet Commonly known as: DIOVAN-HCT Take 1 tablet by mouth daily.         Leslie Prows, MD, [facility] 02/08/2022, 3:31 PM Office: [phone]

## 2022-02-08 NOTE — Plan of Care (Signed)

## 2022-02-09 DIAGNOSIS — I4892 Unspecified atrial flutter: Secondary | ICD-10-CM | POA: Diagnosis not present

## 2022-02-09 DIAGNOSIS — I10 Essential (primary) hypertension: Secondary | ICD-10-CM | POA: Diagnosis not present

## 2022-02-09 LAB — CBC WITH DIFFERENTIAL/PLATELET
Abs Immature Granulocytes: 0.06 10*3/uL (ref 0.00–0.07)
Basophils Absolute: 0.1 10*3/uL (ref 0.0–0.1)
Basophils Relative: 1 %
Eosinophils Absolute: 0.2 10*3/uL (ref 0.0–0.5)
Eosinophils Relative: 2 %
HCT: 42.4 % (ref 39.0–52.0)
Hemoglobin: 14.2 g/dL (ref 13.0–17.0)
Immature Granulocytes: 1 %
Lymphocytes Relative: 19 %
Lymphs Abs: 2 10*3/uL (ref 0.7–4.0)
MCH: 31 pg (ref 26.0–34.0)
MCHC: 33.5 g/dL (ref 30.0–36.0)
MCV: 92.6 fL (ref 80.0–100.0)
Monocytes Absolute: 0.9 10*3/uL (ref 0.1–1.0)
Monocytes Relative: 9 %
Neutro Abs: 7 10*3/uL (ref 1.7–7.7)
Neutrophils Relative %: 68 %
Platelets: 154 10*3/uL (ref 150–400)
RBC: 4.58 MIL/uL (ref 4.22–5.81)
RDW: 14.6 % (ref 11.5–15.5)
WBC: 10.2 10*3/uL (ref 4.0–10.5)
nRBC: 0 % (ref 0.0–0.2)

## 2022-02-09 LAB — ECHOCARDIOGRAM COMPLETE
Area-P 1/2: 4.01 cm2
Height: 70 in
S' Lateral: 4.1 cm
Single Plane A2C EF: 43.4 %
Weight: 5504 oz

## 2022-02-09 LAB — BASIC METABOLIC PANEL
Anion gap: 6 (ref 5–15)
BUN: 17 mg/dL (ref 8–23)
CO2: 22 mmol/L (ref 22–32)
Calcium: 8.9 mg/dL (ref 8.9–10.3)
Chloride: 109 mmol/L (ref 98–111)
Creatinine, Ser: 1.1 mg/dL (ref 0.61–1.24)
GFR, Estimated: >60 mL/min (ref >60)
Glucose, Bld: 111 mg/dL — ABNORMAL HIGH (ref 70–99)
Potassium: 4.3 mmol/L (ref 3.5–5.1)
Sodium: 137 mmol/L (ref 135–145)

## 2022-02-09 LAB — ECHO TEE
Calc EF: 45.5 %
S' Lateral: 4.2 cm
Single Plane A2C EF: 43.3 %
Single Plane A4C EF: 44.6 %

## 2022-02-09 MED ORDER — METOPROLOL SUCCINATE ER 200 MG PO TB24: 100.0000 mg | ORAL_TABLET | Freq: Every day | ORAL | 2 refills | Status: DC

## 2022-02-09 NOTE — Plan of Care (Signed)

## 2022-02-16 DIAGNOSIS — Z09 Encounter for follow-up examination after completed treatment for conditions other than malignant neoplasm: Secondary | ICD-10-CM | POA: Diagnosis not present

## 2022-02-16 DIAGNOSIS — I4892 Unspecified atrial flutter: Secondary | ICD-10-CM | POA: Diagnosis not present

## 2022-02-16 DIAGNOSIS — I10 Essential (primary) hypertension: Secondary | ICD-10-CM | POA: Diagnosis not present

## 2022-02-16 DIAGNOSIS — E785 Hyperlipidemia, unspecified: Secondary | ICD-10-CM | POA: Diagnosis not present

## 2022-02-22 ENCOUNTER — Encounter: Payer: Self-pay | Admitting: Cardiology

## 2022-02-22 ENCOUNTER — Ambulatory Visit: Payer: BC Managed Care – PPO | Admitting: Cardiology

## 2022-02-22 ENCOUNTER — Other Ambulatory Visit: Payer: Self-pay

## 2022-02-22 VITALS — BP 159/100 | HR 67 | Temp 98.4°F | Resp 17 | Ht 70.0 in | Wt 352.4 lb

## 2022-02-22 DIAGNOSIS — I483 Typical atrial flutter: Secondary | ICD-10-CM

## 2022-02-22 DIAGNOSIS — N200 Calculus of kidney: Secondary | ICD-10-CM

## 2022-02-22 DIAGNOSIS — I10 Essential (primary) hypertension: Secondary | ICD-10-CM | POA: Diagnosis not present

## 2022-02-22 NOTE — Progress Notes (Signed)
Primary Physician/Referring:  Deland Pretty, MD  Patient ID: Leslie Soto, male    DOB: July 16, 1961, 61 y.o.   MRN: [id number]  Chief Complaint  Patient presents with   Atrial Fibrillation   Hospitalization Follow-up    2 WEEK   HPI:    Leslie Soto  is a 61 y.o. Caucasian male patient  with past medical history of morbid obesity, hypertension, obstructive sleep apnea on CPAP therapy and has had recurrent episodes of nephrolithiasis admitted to the hospital on 02/06/2022 directly from the PCP office due to new onset typical atrial flutter with RVR.  Underwent TEE guided cardioversion and discharged 2 days later as he had developed hematuria on Eliquis which spontaneously cleared.  He now presents for follow-up.  States that after he returned home he gained his weight back, his blood pressure has been going up.  Otherwise dyspnea is remained stable, no PND or orthopnea or leg edema.  Past Medical History:  Diagnosis Date   Abnormal EKG    hx right bundle branck block on ekg   Asthma    cold weather induced   Bronchial asthma 02/18/2019   Degenerative disc disease    ?   Gastric ulcer with hemorrhage 08/28/2012   GI bleed due to NSAIDs    History of kidney stones    Hypertension    Laboratory examination 02/18/2019   Lower back pain    Obesity (BMI 35.0-39.9 without comorbidity)    OSA (obstructive sleep apnea)    Dr. Vertell Limber, [place]-uses cpap at night  set on 10-8   Palpitations 02/18/2019   Pneumonia 8 years ago   walking pneumonia    Right bundle branch block (RBBB) 02/18/2019   Shortness of breath 02/18/2019   Spina bifida ([place])    L 2 or L 3 defect creating left leg pain    Past Surgical History:  Procedure Laterality Date   CARDIOVASCULAR STRESS TEST  06/12/2012   Normal pattern of perfusion in all regions, no ECG changes, EKG negative for ischemia.   CARDIOVERSION N/A 02/07/2022   Procedure: CARDIOVERSION;  Surgeon: Adrian Prows, MD;  Location: [place];  Service:  Cardiovascular;  Laterality: N/A;   COLONOSCOPY WITH PROPOFOL N/A 12/07/2017   Procedure: COLONOSCOPY WITH PROPOFOL;  Surgeon: Carol Ada, MD;  [facility];  Service: Endoscopy;  Laterality: N/A;   CYSTOSCOPY/URETEROSCOPY/HOLMIUM LASER/STENT PLACEMENT Bilateral 01/31/2017   Procedure: CYSTOSCOPY/BILATERAL RETROGRADE PYELOGRAM/ LEFT URETEROSCOPYLEFT /HOLMIUM LASER/BILATERAL STENT PLACEMENT;  Surgeon: Alexis Frock, MD;  [facility];  Service: Urology;  Laterality: Bilateral;   CYSTOSCOPY/URETEROSCOPY/HOLMIUM LASER/STENT PLACEMENT Bilateral 02/21/2017   Procedure: CYSTOSCOPY/URETEROSCOPY/HOLMIUM LASER/STENT REPLACEMENT BILATERAL RETROGRADE;  Surgeon: Alexis Frock, MD;  [facility];  Service: Urology;  Laterality: Bilateral;   ESOPHAGOGASTRODUODENOSCOPY  08/28/2012   Procedure: ESOPHAGOGASTRODUODENOSCOPY (EGD);  Surgeon: Inda Castle, MD;  Location: [place];  Service: Endoscopy;  Laterality: N/A;   ESOPHAGOGASTRODUODENOSCOPY (EGD) WITH PROPOFOL N/A 12/07/2017   Procedure: ESOPHAGOGASTRODUODENOSCOPY (EGD) WITH PROPOFOL;  Surgeon: Carol Ada, MD;  [facility];  Service: Endoscopy;  Laterality: N/A;   EUS N/A 01/04/2018   Procedure: UPPER ENDOSCOPIC ULTRASOUND (EUS) LINEAR;  Surgeon: Carol Ada, MD;  [facility];  Service: Endoscopy;  Laterality: N/A;   EXTRACORPOREAL SHOCK WAVE LITHOTRIPSY     2003   EYE SURGERY Bilateral    1969 weak eye muscle   FINE NEEDLE ASPIRATION N/A 01/04/2018   Procedure: FINE NEEDLE ASPIRATION (FNA) LINEAR;  Surgeon: Carol Ada, MD;  [facility];  Service:  Endoscopy;  Laterality: N/A;   HERNIA REPAIR  09/20/1983   87, 86   HERNIA REPAIR  09/25/1997   KNEE ARTHROSCOPY Left 01/20/2011   TEE WITHOUT CARDIOVERSION N/A 02/07/2022   Procedure: TRANSESOPHAGEAL ECHOCARDIOGRAM (TEE);  Surgeon: Adrian Prows, MD;  Location: [facility];  Service: Cardiovascular;  Laterality: N/A;   TRANSTHORACIC ECHOCARDIOGRAM   04/03/2013   EF 55-60%, moderate concentric hypertrophy,    Family History  Problem Relation Age of Onset   Mental illness Father    Heart disease Father    Diabetes Father    Hypertension Father    Stroke Mother    Hypertension Mother    Hypertension Brother    Prostate cancer Brother    Prostate cancer Brother     Social History   Tobacco Use   Smoking status: Never   Smokeless tobacco: Never  Substance Use Topics   Alcohol use: No   Marital Status: Widowed  ROS  Review of Systems  Cardiovascular:  Positive for dyspnea on exertion. Negative for chest pain and leg swelling.  Objective  Blood pressure (!) 159/100, pulse 67, temperature 98.4 F (36.9 C), temperature source Temporal, resp. rate 17, height _0  (1.778 m), weight (!) 352 lb 6.4 oz (159.8 kg), SpO2 98 %. Body mass index is 50.56 kg/m.  Vitals with BMI 02/22/2022 02/22/2022 02/09/2022  Height - _1  -  Weight - 352 lbs 6 oz -  BMI - 51.88 -  Systolic 416 606 301  Diastolic 601 98 80  Pulse 67 67 79    Physical Exam Constitutional:      Appearance: He is obese.  Neck:     Vascular: No JVD.  Cardiovascular:     Rate and Rhythm: Normal rate and regular rhythm.     Pulses: Intact distal pulses.     Heart sounds: Normal heart sounds. No murmur heard.   No gallop.  Pulmonary:     Effort: Pulmonary effort is normal.     Breath sounds: Normal breath sounds.  Abdominal:     General: Bowel sounds are normal.     Palpations: Abdomen is soft.  Musculoskeletal:     Right lower leg: No edema.     Left lower leg: No edema.     Medications and allergies  No Known Allergies   Medication list after today's encounter   Current Outpatient Medications:    acetaminophen (TYLENOL) 500 MG tablet, Take 1,000 mg by mouth every 8 (eight) hours as needed for mild pain., Disp: , Rfl:    amLODipine (NORVASC) 10 MG tablet, Take 1 tablet (10 mg total) by mouth daily., Disp: 90 tablet, Rfl: 3   apixaban (ELIQUIS) 5 MG TABS  tablet, Take 1 tablet (5 mg total) by mouth 2 (two) times daily., Disp: 60 tablet, Rfl: 3   atorvastatin (LIPITOR) 10 MG tablet, Take 1 tablet (10 mg total) by mouth daily. (Patient taking differently: Take 5 mg by mouth daily.), Disp: 90 tablet, Rfl: 3   fluocinonide cream (LIDEX) 0.93 %, Apply 1 application. topically as needed., Disp: , Rfl:    Glucos-MSM-C-Mn-Ginger-Willow (GLUCOSAMINE MSM COMPLEX) TABS, Take 1 tablet by mouth daily., Disp: , Rfl:    ketoconazole (NIZORAL) 2 % cream, Apply 1 application. topically as needed., Disp: , Rfl:    Magnesium 500 MG CAPS, Take 500 mg by mouth every other day., Disp: , Rfl:    Turmeric-Ginger 150-25 MG CHEW, Chew 1 capsule by mouth daily., Disp: , Rfl:    valsartan-hydrochlorothiazide (  DIOVAN-HCT) 320-12.5 MG tablet, Take 1 tablet by mouth daily., Disp: , Rfl:    metoprolol (TOPROL-XL) 200 MG 24 hr tablet, Take 1 tablet (200 mg total) by mouth daily., Disp: 30 tablet, Rfl: 2  Laboratory examination:   Recent Labs    02/06/22 1210 02/07/22 0637 02/09/22 0624  NA 139 138 137  K 3.2* 3.8 4.3  CL 111 109 109  CO2 18* 21* 22  GLUCOSE 116* 110* 111*  Soto 7* 10 17  CREATININE 0.77 0.85 1.10  CALCIUM 8.7* 8.7* 8.9  GFRNONAA >60 >60 >60   estimated creatinine clearance is 104.7 mL/min (by C-G formula based on SCr of 1.1 mg/dL).  CMP Latest Ref Rng & Units 02/09/2022 02/07/2022 02/06/2022  Glucose 70 - 99 mg/dL 111(H) 110(H) 116(H)  Soto 8 - 23 mg/dL 17 10 7(L)  Creatinine 0.61 - 1.24 mg/dL 1.10 0.85 0.77  Sodium 135 - 145 mmol/L 137 138 139  Potassium 3.5 - 5.1 mmol/L 4.3 3.8 3.2(L)  Chloride 98 - 111 mmol/L 109 109 111  CO2 22 - 32 mmol/L 22 21(L) 18(L)  Calcium 8.9 - 10.3 mg/dL 8.9 8.7(L) 8.7(L)  Total Protein 6.5 - 8.1 g/dL - - 6.6  Total Bilirubin 0.3 - 1.2 mg/dL - - 1.5(H)  Alkaline Phos 38 - 126 U/L - - 72  AST 15 - 41 U/L - - 22  ALT 0 - 44 U/L - - 21   CBC Latest Ref Rng & Units 02/09/2022 02/06/2022 11/05/2021  WBC 4.0 - 10.5 K/uL  10.2 9.3 11.5(H)  Hemoglobin 13.0 - 17.0 g/dL 14.2 15.3 16.9  Hematocrit 39.0 - 52.0 % 42.4 44.6 49.8  Platelets 150 - 400 K/uL 154 178 187   TSH Recent Labs    02/06/22 1739  TSH 1.090   BNP (last 3 results) Recent Labs    02/06/22 1739  BNP 212.6*   External labs:   Labs 02/03/2022:  A1c 6.2%.  Total cholesterol 111, triglycerides 121, HDL 28, LDL 59.  Radiology:   [facility] Chest Port 1 View  02/06/2022 CLINICAL DATA:  Tachycardia EXAM: PORTABLE CHEST 1 VIEW COMPARISON:  CT examination dated November 05, 2021 FINDINGS: The heart size and mediastinal contours are within normal limits. Low lung volumes with left basilar atelectasis. No focal consolidation or large pleural effusion. The visualized skeletal structures are unremarkable. IMPRESSION: Low lung volumes with left basilar atelectasis. No focal consolidation or large pleural effusion. Electronically Signed   By: Keane Police D.O.   On: 02/06/2022 12:25    Cardiac Studies:   PCV ECHOCARDIOGRAM COMPLETE 26/94/8546 Normal LV systolic function with visual EF 50-55%. Left ventricle cavity is normal in size. Mild to Moderate left ventricular hypertrophy. Normal global wall motion. Indeterminate diastolic filling pattern, elevated LAP. Mild (Grade I) mitral regurgitation. Mild tricuspid regurgitation. The aortic root is mildly dilated. Proximal ascending aorta not well visualized. Compared to prior study dated 04/04/2017 Mild MR and TR are new, aortic dilatation is new.     PCV MYOCARDIAL PERFUSION WITH LEXISCAN 04/12/2020 Lexiscan stress. Patient achieved THR with mod Bruce protocol. No ST-T changes of ischemia. The heart rate response was accelerated. The baseline blood pressure was 198/110 mmHg. Maximum blood pressure post injection was 238/118 mmHg. Hypertensive throughout. Myocardial perfusion is normal. Overall LV systolic function is abnormal without regional wall motion abnormalities. Stress LV EF: 44%.  Findings suggest  non ischemic cardiomyopathy. No previous exam available for comparison.  Intermediate risk.  TEE guided direct-current cardioversion 02/08/2022:  1. Left  ventricular ejection fraction, by estimation, is 40 to 45%. Left ventricular ejection fraction by PLAX is 45 %. The left ventricle has mildly decreased function. The left ventricle demonstrates global hypokinesis. Left ventricular diastolic  function could not be evaluated.  2. Right ventricular systolic function is normal. The right ventricular size is normal. There is normal pulmonary artery systolic pressure.  3. Small LAA. No thrombus and normal flow velocity.  4. The mitral valve is normal in structure. Trivial mitral valve regurgitation. No evidence of mitral stenosis.  5. The aortic valve is normal in structure. Aortic valve regurgitation is not visualized. No aortic stenosis is present.  Under MAC anesthesia, 100 J of electricity was delivered in a synchronized fashion, patient converted from typical atrial flutter to normal sinus rhythm with occasional PACs.  Patient tolerated the procedure well.  No immediate complication.   EKG:   EKG 02/22/2022: Normal sinus rhythm at rate of 59 bpm, left atrial enlargement, left axis deviation, left anterior fascicular block.  Right bundle branch block.  T wave abnormality, cannot exclude inferior and anterolateral ischemia.  Normal QT interval. No significant change from 02/07/2022.   EKG 02/06/2022 and also 02/07/2022: Typical atrial flutter with variable AV conduction with nonspecific T abnormality.  EKG 04/28/2021: Normal sinus rhythm at rate of 64 bpm, left axis deviation, left anterior fascicular block.  Right bundle branch block.  Bifascicular block.  Low-voltage complexes.  Pulmonary disease pattern.  TWI, CRO anterolateral ischemia.   Assessment     ICD-10-CM   1. Typical atrial flutter (HCC)  I48.3 metoprolol (TOPROL-XL) 200 MG 24 hr tablet    Ambulatory referral to Cardiac  Electrophysiology    2. Primary hypertension  I10 EKG 12-Lead    amLODipine (NORVASC) 10 MG tablet    3. Kidney stones  N20.0       Medications Discontinued During This Encounter  Medication Reason   diltiazem (CARDIZEM CD) 180 MG 24 hr capsule Change in therapy   metoprolol (TOPROL-XL) 200 MG 24 hr tablet Reorder    No orders of the defined types were placed in this encounter.  Orders Placed This Encounter  Procedures   Ambulatory referral to Cardiac Electrophysiology    Referral Priority:   Routine    Referral Type:   Consultation    Referral Reason:   Specialty Services Required    Requested Specialty:   Cardiology    Number of Visits Requested:   1   EKG 12-Lead   Recommendations:   Leslie Soto is a 61 y.o. Caucasian male patient  with past medical history of morbid obesity, hypertension, obstructive sleep apnea on CPAP therapy and has had recurrent episodes of nephrolithiasis admitted to the hospital on 02/06/2022 directly from the PCP office due to new onset typical atrial flutter with RVR.  Underwent TEE guided cardioversion and discharged 2 days later as he had developed hematuria on Eliquis which spontaneously cleared.  He now presents for follow-up.  Patient's medications including metoprolol succinate was discontinued as he was started on amlodipine and his blood pressure was very soft.  However after he returned back home, he has gained the weight he had lost about 5 pounds and blood pressure is now uncontrolled.  I have discontinued diltiazem CD and placed him back on metoprolol succinate 200 mg daily, previously was on 100 mg in the morning and 50 mg in the evening.  I will also restart him back on the amlodipine 10 mg daily which controlled his blood pressure.  Suspect uncontrolled hypertension is related to his poor diet after he returned home as blood pressure was very soft while in the hospital.  I had a discussion regarding diet.  With regard to atrial flutter, he  had typical atrial flutter.  I would like to get opinion from EP to see whether ablation would be appropriate so he can come off of long-term anticoagulants.  For now continue Eliquis.  Lipids are well controlled, physical examination reveals no evidence of heart failure, I will see him back in 6 weeks for follow-up.    Adrian Prows, MD, [facility] 02/22/2022, 9:39 AM Office: [phone]

## 2022-03-09 ENCOUNTER — Ambulatory Visit: Payer: BC Managed Care – PPO | Admitting: Cardiology

## 2022-03-09 ENCOUNTER — Other Ambulatory Visit: Payer: Self-pay

## 2022-03-09 ENCOUNTER — Encounter: Payer: Self-pay | Admitting: Cardiology

## 2022-03-09 VITALS — BP 128/80 | HR 80 | Ht 70.0 in | Wt 346.0 lb

## 2022-03-09 DIAGNOSIS — I483 Typical atrial flutter: Secondary | ICD-10-CM

## 2022-03-09 DIAGNOSIS — I4892 Unspecified atrial flutter: Secondary | ICD-10-CM

## 2022-03-09 MED ORDER — FLECAINIDE ACETATE 50 MG PO TABS: 50.0000 mg | ORAL_TABLET | Freq: Two times a day (BID) | ORAL | 3 refills | Status: DC

## 2022-03-09 NOTE — Progress Notes (Signed)
? ?Electrophysiology Office Note ? ? ?Date:  03/09/2022  ? ?ID:  Leslie Soto, DOB Nov 10, 1961, MRN [id number] ? ?PCP:  Deland Pretty, MD  ?Cardiologist:  Einar Gip ?Primary Electrophysiologist:  Pasha Gadison Meredith Leeds, MD   ? ?Chief Complaint: AF ?  ?History of Present Illness: ?Leslie Soto is a 61 y.o. male who is being seen today for the evaluation of AF at the request of Adrian Prows, MD. Presenting today for electrophysiology evaluation. ? ?He has a history significant for morbid obesity, hypertension, obstructive sleep apnea on CPAP.  He was admitted to hospital 02/06/2022 from his PCPs office due to new onset typical atrial flutter with rapid response.  He had a cardioversion while in the hospital.  2 days after his cardioversion he developed hematuria on Eliquis but this spontaneously cleared.  He was minimally aware of his atrial flutter while he was in the hospital.  He had some mild palpitations and shortness of breath but otherwise felt well.  He has done well since his cardioversion. ? ?Today, he denies symptoms of palpitations, chest pain, shortness of breath, orthopnea, PND, lower extremity edema, claudication, dizziness, presyncope, syncope, bleeding, or neurologic sequela. The patient is tolerating medications without difficulties.  ? ? ?Past Medical History:  ?Diagnosis Date  ? Abnormal EKG   ? hx right bundle branck block on ekg  ? Asthma   ? cold weather induced  ? Bronchial asthma 02/18/2019  ? Degenerative disc disease   ? ?  ? Gastric ulcer with hemorrhage 08/28/2012  ? GI bleed due to NSAIDs   ? History of kidney stones   ? Hypertension   ? Laboratory examination 02/18/2019  ? Lower back pain   ? Obesity (BMI 35.0-39.9 without comorbidity)   ? OSA (obstructive sleep apnea)   ? Dr. Vertell Limber, [place]-uses cpap at night  set on 10-8  ? Palpitations 02/18/2019  ? Pneumonia 8 years ago  ? walking pneumonia   ? Right bundle branch block (RBBB) 02/18/2019  ? Shortness of breath 02/18/2019  ? Spina bifida ([place])   ? L 2  or L 3 defect creating left leg pain   ? ?Past Surgical History:  ?Procedure Laterality Date  ? CARDIOVASCULAR STRESS TEST  06/12/2012  ? Normal pattern of perfusion in all regions, no ECG changes, EKG negative for ischemia.  ? CARDIOVERSION N/A 02/07/2022  ? Procedure: CARDIOVERSION;  Surgeon: Adrian Prows, MD;  Location: [place];  Service: Cardiovascular;  Laterality: N/A;  ? COLONOSCOPY WITH PROPOFOL N/A 12/07/2017  ? Procedure: COLONOSCOPY WITH PROPOFOL;  Surgeon: Carol Ada, MD;  [facility];  Service: Endoscopy;  Laterality: N/A;  ? CYSTOSCOPY/URETEROSCOPY/HOLMIUM LASER/STENT PLACEMENT Bilateral 01/31/2017  ? Procedure: CYSTOSCOPY/BILATERAL RETROGRADE PYELOGRAM/ LEFT URETEROSCOPYLEFT Moran LASER/BILATERAL STENT PLACEMENT;  Surgeon: Alexis Frock, MD;  [facility];  Service: Urology;  Laterality: Bilateral;  ? CYSTOSCOPY/URETEROSCOPY/HOLMIUM LASER/STENT PLACEMENT Bilateral 02/21/2017  ? Procedure: CYSTOSCOPY/URETEROSCOPY/HOLMIUM LASER/STENT REPLACEMENT BILATERAL RETROGRADE;  Surgeon: Alexis Frock, MD;  [facility];  Service: Urology;  Laterality: Bilateral;  ? ESOPHAGOGASTRODUODENOSCOPY  08/28/2012  ? Procedure: ESOPHAGOGASTRODUODENOSCOPY (EGD);  Surgeon: Inda Castle, MD;  Location: [place];  Service: Endoscopy;  Laterality: N/A;  ? ESOPHAGOGASTRODUODENOSCOPY (EGD) WITH PROPOFOL N/A 12/07/2017  ? Procedure: ESOPHAGOGASTRODUODENOSCOPY (EGD) WITH PROPOFOL;  Surgeon: Carol Ada, MD;  [facility];  Service: Endoscopy;  Laterality: N/A;  ? EUS N/A 01/04/2018  ? Procedure: UPPER ENDOSCOPIC ULTRASOUND (EUS) LINEAR;  Surgeon: Carol Ada, MD;  [facility];  Service: Endoscopy;  Laterality: N/A;  ?  EXTRACORPOREAL SHOCK WAVE LITHOTRIPSY    ? 2003  ? EYE SURGERY Bilateral   ? 1969 weak eye muscle  ? FINE NEEDLE ASPIRATION N/A 01/04/2018  ? Procedure: FINE NEEDLE ASPIRATION (FNA) LINEAR;  Surgeon: Carol Ada, MD;  [facility];  Service: Endoscopy;   Laterality: N/A;  ? HERNIA REPAIR  09/20/1983  ? 87, 86  ? HERNIA REPAIR  09/25/1997  ? KNEE ARTHROSCOPY Left 01/20/2011  ? TEE WITHOUT CARDIOVERSION N/A 02/07/2022  ? Procedure: TRANSESOPHAGEAL ECHOCARDIOGRAM (TEE);  Surgeon: Adrian Prows, MD;  Location: [place];  Service: Cardiovascular;  Laterality: N/A;  ? TRANSTHORACIC ECHOCARDIOGRAM  04/03/2013  ? EF 55-60%, moderate concentric hypertrophy,   ? ? ? ?Current Outpatient Medications  ?Medication Sig Dispense Refill  ? acetaminophen (TYLENOL) 500 MG tablet Take 1,000 mg by mouth every 8 (eight) hours as needed for mild pain.    ? amLODipine (NORVASC) 10 MG tablet Take 1 tablet (10 mg total) by mouth daily. 90 tablet 3  ? apixaban (ELIQUIS) 5 MG TABS tablet Take 1 tablet (5 mg total) by mouth 2 (two) times daily. 60 tablet 3  ? atorvastatin (LIPITOR) 10 MG tablet Take 1 tablet by mouth in the morning and 1/2 tablet in the evening    ? flecainide (TAMBOCOR) 50 MG tablet Take 1 tablet (50 mg total) by mouth 2 (two) times daily. 180 tablet 3  ? fluocinonide cream (LIDEX) 7.82 % Apply 1 application. topically as needed.    ? Glucos-MSM-C-Mn-Ginger-Willow (GLUCOSAMINE MSM COMPLEX) TABS Take 1 tablet by mouth daily.    ? ketoconazole (NIZORAL) 2 % cream Apply 1 application. topically as needed.    ? Magnesium 500 MG CAPS Take 500 mg by mouth every other day.    ? metoprolol (TOPROL-XL) 200 MG 24 hr tablet Take 1 tablet (200 mg total) by mouth daily. 30 tablet 2  ? Turmeric-Ginger 150-25 MG CHEW Chew 1 capsule by mouth daily.    ? valsartan-hydrochlorothiazide (DIOVAN-HCT) 320-12.5 MG tablet Take 1 tablet by mouth daily.    ? ?No current facility-administered medications for this visit.  ? ? ?Allergies:   Patient has no known allergies.  ? ?Social History:  The patient  reports that he has never smoked. He has never used smokeless tobacco. He reports that he does not drink alcohol and does not use drugs.  ? ?Family History:  The patient's family history includes Diabetes  in his father; Heart disease in his father; Hypertension in his brother, father, and mother; Mental illness in his father; Prostate cancer in his brother and brother; Stroke in his mother.  ? ? ?ROS:  Please see the history of present illness.   Otherwise, review of systems is positive for none.   All other systems are reviewed and negative.  ? ? ?PHYSICAL EXAM: ?VS:  BP 128/80   Pulse 80   Ht '5\' 10"'$  (1.778 m)   Wt (!) 346 lb (156.9 kg)   SpO2 94%   BMI 49.65 kg/m?  , BMI Body mass index is 49.65 kg/m?. ?GEN: Well nourished, well developed, in no acute distress  ?HEENT: normal  ?Neck: no JVD, carotid bruits, or masses ?Cardiac: RRR; no murmurs, rubs, or gallops,no edema  ?Respiratory:  clear to auscultation bilaterally, normal work of breathing ?GI: soft, nontender, nondistended, + BS ?MS: no deformity or atrophy  ?Skin: warm and dry ?Neuro:  Strength and sensation are intact ?Psych: euthymic mood, full affect ? ?EKG:  EKG is not ordered today. ?Personal review of  the ekg ordered 02/07/22 shows atrial flutter ? ?Recent Labs: ?02/06/2022: ALT 21; B Natriuretic Peptide 212.6; TSH 1.090 ?02/09/2022: Soto 17; Creatinine, Ser 1.10; Hemoglobin 14.2; Platelets 154; Potassium 4.3; Sodium 137  ? ? ?Lipid Panel  ?No results found for: CHOL, TRIG, HDL, CHOLHDL, VLDL, LDLCALC, LDLDIRECT ? ? ?Wt Readings from Last 3 Encounters:  ?03/09/22 (!) 346 lb (156.9 kg)  ?02/22/22 (!) 352 lb 6.4 oz (159.8 kg)  ?02/07/22 (!) 344 lb (156 kg)  ?  ? ? ?Other studies Reviewed: ?Additional studies/ records that were reviewed today include: TTE 02/09/22  ?Review of the above records today demonstrates:  ? 1. Left ventricular ejection fraction, by estimation, is 40 to 45%. Left  ?ventricular ejection fraction by PLAX is 45 %. The left ventricle has  ?mildly decreased function. The left ventricle demonstrates global  ?hypokinesis. Left ventricular diastolic  ?function could not be evaluated.  ? 2. Right ventricular systolic function is normal. The  right ventricular  ?size is normal. There is normal pulmonary artery systolic pressure.  ? 3. Small LAA. No thrombus and normal flow velocity.  ? 4. The mitral valve is normal in structure. Trivial mitra

## 2022-03-09 NOTE — Patient Instructions (Signed)
Medication Instructions:  ?Your physician has recommended you make the following change in your medication:  ? ?Begin Flecainide '50mg'$  - 1 tablet by mouth twice daily. ? ? ?*If you need a refill on your cardiac medications before your next appointment, please call your pharmacy* ? ? ?Lab Work: ?None ordered. today ?If you have labs (blood work) drawn today and your tests are completely normal, you will receive your results only by: ?MyChart Message (if you have MyChart) OR ?A paper copy in the mail ?If you have any lab test that is abnormal or we need to change your treatment, we will call you to review the results. ? ? ?Testing/Procedures: ?None ordered. ? ? ? ?Follow-Up: ?At [facility], you and your health needs are our priority.  As part of our continuing mission to provide you with exceptional heart care, we have created designated Provider Care Teams.  These Care Teams include your primary Cardiologist (physician) and Advanced Practice Providers (APPs -  Physician Assistants and Nurse Practitioners) who all work together to provide you with the care you need, when you need it. ? ?We recommend signing up for the patient portal called "MyChart".  Sign up information is provided on this After Visit Summary.  MyChart is used to connect with patients for Virtual Visits (Telemedicine).  Patients are able to view lab/test results, encounter notes, upcoming appointments, etc.  Non-urgent messages can be sent to your provider as well.   ?To learn more about what you can do with MyChart, go to [email or web address].   ? ?Your next appointment:   ?6 months with Oda Kilts, PA-C ?

## 2022-04-05 ENCOUNTER — Ambulatory Visit: Payer: BC Managed Care – PPO | Admitting: Cardiology

## 2022-04-14 ENCOUNTER — Ambulatory Visit: Payer: BC Managed Care – PPO | Admitting: Cardiology

## 2022-04-14 ENCOUNTER — Encounter: Payer: Self-pay | Admitting: Cardiology

## 2022-04-14 VITALS — BP 125/73 | HR 65 | Temp 98.2°F | Resp 16 | Ht 70.0 in | Wt 357.0 lb

## 2022-04-14 DIAGNOSIS — I10 Essential (primary) hypertension: Secondary | ICD-10-CM

## 2022-04-14 DIAGNOSIS — I483 Typical atrial flutter: Secondary | ICD-10-CM | POA: Diagnosis not present

## 2022-04-14 DIAGNOSIS — Z6841 Body Mass Index (BMI) 40.0 and over, adult: Secondary | ICD-10-CM | POA: Diagnosis not present

## 2022-04-14 NOTE — Progress Notes (Signed)
? ?Primary Physician/Referring:  Deland Pretty, MD ? ?Patient ID: Leslie Soto, male    DOB: 01-24-1961, 61 y.o.   MRN: [id number] ? ?Chief Complaint  ?Patient presents with  ? Atrial Fibrillation  ? Hypertension  ? Follow-up  ?  6 weeks  ? ?HPI:   ? ?Leslie Soto  is a 61 y.o. Caucasian male patient  with past medical history of morbid obesity, hypertension, obstructive sleep apnea on CPAP therapy and has had recurrent episodes of nephrolithiasis admitted to the hospital on 02/06/2022 with new onset typical atrial flutter with RVR.  Underwent TEE guided cardioversion and discharged 2 days later on Eliquis.   ? ?Following discharge patient gained weight back and blood pressure, which had been soft during hospitalization, was uncontrolled at follow-up visit in our office. ? ?Patient presents for 6-week follow-up.  Last office visit switch patient from diltiazem to Toprol-XL 200 mg daily and restarted amlodipine 10 mg daily given uncontrolled hypertension.  Since last office visit patient has been evaluated by Dr. Curt Bears who started him on flecainide 50 mg daily with plan to reevaluate candidacy for ablation if patient remains compliant and loses weight.  Patient is feeling well from a cardiovascular standpoint without known recurrence of atrial flutter.  He unfortunately has continued to gain weight, his physical activity has been limited due to knee pain for which she follows with orthopedics.  He continues to tolerate anticoagulation without bleeding diathesis. ? ?Past Medical History:  ?Diagnosis Date  ? Abnormal EKG   ? hx right bundle branck block on ekg  ? Asthma   ? cold weather induced  ? Bronchial asthma 02/18/2019  ? Degenerative disc disease   ? ?  ? Gastric ulcer with hemorrhage 08/28/2012  ? GI bleed due to NSAIDs   ? History of kidney stones   ? Hypertension   ? Laboratory examination 02/18/2019  ? Lower back pain   ? Obesity (BMI 35.0-39.9 without comorbidity)   ? OSA (obstructive sleep apnea)   ? Dr.  Vertell Limber, [place]-uses cpap at night  set on 10-8  ? Palpitations 02/18/2019  ? Pneumonia 8 years ago  ? walking pneumonia   ? Right bundle branch block (RBBB) 02/18/2019  ? Shortness of breath 02/18/2019  ? Spina bifida ([place])   ? L 2 or L 3 defect creating left leg pain   ? ?Past Surgical History:  ?Procedure Laterality Date  ? CARDIOVASCULAR STRESS TEST  06/12/2012  ? Normal pattern of perfusion in all regions, no ECG changes, EKG negative for ischemia.  ? CARDIOVERSION N/A 02/07/2022  ? Procedure: CARDIOVERSION;  Surgeon: Adrian Prows, MD;  Location: [place];  Service: Cardiovascular;  Laterality: N/A;  ? COLONOSCOPY WITH PROPOFOL N/A 12/07/2017  ? Procedure: COLONOSCOPY WITH PROPOFOL;  Surgeon: Carol Ada, MD;  Location: [facility];  Service: Endoscopy;  Laterality: N/A;  ? CYSTOSCOPY/URETEROSCOPY/HOLMIUM LASER/STENT PLACEMENT Bilateral 01/31/2017  ? Procedure: CYSTOSCOPY/BILATERAL RETROGRADE PYELOGRAM/ LEFT URETEROSCOPYLEFT [place] LASER/BILATERAL STENT PLACEMENT;  Surgeon: Alexis Frock, MD;  Location: [facility];  Service: Urology;  Laterality: Bilateral;  ? CYSTOSCOPY/URETEROSCOPY/HOLMIUM LASER/STENT PLACEMENT Bilateral 02/21/2017  ? Procedure: CYSTOSCOPY/URETEROSCOPY/HOLMIUM LASER/STENT REPLACEMENT BILATERAL RETROGRADE;  Surgeon: Alexis Frock, MD;  Location: [facility];  Service: Urology;  Laterality: Bilateral;  ? ESOPHAGOGASTRODUODENOSCOPY  08/28/2012  ? Procedure: ESOPHAGOGASTRODUODENOSCOPY (EGD);  Surgeon: Inda Castle, MD;  Location: [place];  Service: Endoscopy;  Laterality: N/A;  ? ESOPHAGOGASTRODUODENOSCOPY (EGD) WITH PROPOFOL N/A 12/07/2017  ? Procedure: ESOPHAGOGASTRODUODENOSCOPY (EGD) WITH PROPOFOL;  Surgeon: Carol Ada, MD;  Location: [facility];  Service: Endoscopy;  Laterality: N/A;  ? EUS N/A 01/04/2018  ? Procedure: UPPER ENDOSCOPIC ULTRASOUND (EUS) LINEAR;  Surgeon: Carol Ada, MD;  Location: [facility];  Service: Endoscopy;  Laterality: N/A;  ? EXTRACORPOREAL SHOCK WAVE LITHOTRIPSY     ? 2003  ? EYE SURGERY Bilateral   ? 1969 weak eye muscle  ? FINE NEEDLE ASPIRATION N/A 01/04/2018  ? Procedure: FINE NEEDLE ASPIRATION (FNA) LINEAR;  Surgeon: Carol Ada, MD;  Location: [facility];  Service: Endoscopy;  Laterality: N/A;  ? HERNIA REPAIR  09/20/1983  ? 85, 84  ? HERNIA REPAIR  09/25/1997  ? KNEE ARTHROSCOPY Left 01/20/2011  ? TEE WITHOUT CARDIOVERSION N/A 02/07/2022  ? Procedure: TRANSESOPHAGEAL ECHOCARDIOGRAM (TEE);  Surgeon: Adrian Prows, MD;  Location: [place];  Service: Cardiovascular;  Laterality: N/A;  ? TRANSTHORACIC ECHOCARDIOGRAM  04/03/2013  ? EF 55-60%, moderate concentric hypertrophy,   ? ?Family History  ?Problem Relation Age of Onset  ? Mental illness Father   ? Heart disease Father   ? Diabetes Father   ? Hypertension Father   ? Stroke Mother   ? Hypertension Mother   ? Hypertension Brother   ? Prostate cancer Brother   ? Prostate cancer Brother   ?  ?Social History  ? ?Tobacco Use  ? Smoking status: Never  ? Smokeless tobacco: Never  ?Substance Use Topics  ? Alcohol use: No  ? ?Marital Status: Widowed  ?ROS  ?Review of Systems  ?Constitutional: Positive for weight gain.  ?Cardiovascular:  Positive for dyspnea on exertion (stable). Negative for chest pain, leg swelling, palpitations and syncope.  ?Objective  ?Blood pressure 125/73, pulse 65, temperature 98.2 ?F (36.8 ?C), temperature source Temporal, resp. rate 16, height '5\' 10"'$  (1.778 m), weight (!) 357 lb (161.9 kg). Body mass index is 51.22 kg/m?.  ? ?  04/14/2022  ? 10:14 AM 04/14/2022  ? 10:03 AM 03/09/2022  ? 11:01 AM  ?Vitals with BMI  ?Height  '5\' 10"'$  '5\' 10"'$   ?Weight  357 lbs 346 lbs  ?BMI  51.22 49.65  ?Systolic 841 324 401  ?Diastolic 73 81 80  ?Pulse 65 66 80  ?  ?Physical Exam ?Vitals reviewed.  ?Constitutional:   ?   Appearance: He is obese.  ?Neck:  ?   Vascular: No JVD.  ?Cardiovascular:  ?   Rate and Rhythm: Normal rate and regular rhythm.  ?   Pulses: Intact distal pulses.  ?   Heart sounds: Normal heart sounds. No  murmur heard. ?  No gallop.  ?Pulmonary:  ?   Effort: Pulmonary effort is normal.  ?   Breath sounds: Normal breath sounds.  ?Musculoskeletal:  ?   Right lower leg: No edema.  ?   Left lower leg: No edema.  ?  ?Allergies  ?No Known Allergies  ? ?Medications Prior to Visit:  ? ?Outpatient Medications Prior to Visit  ?Medication Sig Dispense Refill  ? amLODipine (NORVASC) 10 MG tablet Take 1 tablet (10 mg total) by mouth daily. 90 tablet 3  ? apixaban (ELIQUIS) 5 MG TABS tablet Take 1 tablet (5 mg total) by mouth 2 (two) times daily. 60 tablet 3  ? atorvastatin (LIPITOR) 10 MG tablet Take 1 tablet by mouth in the morning and 1/2 tablet in the evening    ? flecainide (TAMBOCOR) 50 MG tablet Take 1 tablet (50 mg total) by mouth 2 (two) times daily. 180 tablet 3  ? fluocinonide cream (LIDEX) 0.27 % Apply 1 application. topically  as needed.    ? Glucos-MSM-C-Mn-Ginger-Willow (GLUCOSAMINE MSM COMPLEX) TABS Take 1 tablet by mouth daily.    ? ketoconazole (NIZORAL) 2 % cream Apply 1 application. topically as needed.    ? Magnesium 500 MG CAPS Take 500 mg by mouth every other day.    ? metoprolol (TOPROL-XL) 200 MG 24 hr tablet Take 200 mg by mouth in the morning and at bedtime. 1 tablet in the morning, 1/2 tablet at night 30 tablet 2  ? Turmeric-Ginger 150-25 MG CHEW Chew 1 capsule by mouth daily.    ? valsartan-hydrochlorothiazide (DIOVAN-HCT) 320-12.5 MG tablet Take 1 tablet by mouth daily.    ? acetaminophen (TYLENOL) 500 MG tablet Take 1,000 mg by mouth every 8 (eight) hours as needed for mild pain.    ? ?No facility-administered medications prior to visit.  ? ?Final Medications at End of Visit   ? ?Current Meds  ?Medication Sig  ? amLODipine (NORVASC) 10 MG tablet Take 1 tablet (10 mg total) by mouth daily.  ? apixaban (ELIQUIS) 5 MG TABS tablet Take 1 tablet (5 mg total) by mouth 2 (two) times daily.  ? atorvastatin (LIPITOR) 10 MG tablet Take 1 tablet by mouth in the morning and 1/2 tablet in the evening  ? flecainide  (TAMBOCOR) 50 MG tablet Take 1 tablet (50 mg total) by mouth 2 (two) times daily.  ? fluocinonide cream (LIDEX) 8.33 % Apply 1 application. topically as needed.  ? Glucos-MSM-C-Mn-Ginger-Willow (GLUCO

## 2022-07-17 ENCOUNTER — Other Ambulatory Visit: Payer: Self-pay | Admitting: Cardiology

## 2022-08-09 ENCOUNTER — Other Ambulatory Visit: Payer: Self-pay | Admitting: Cardiology

## 2022-08-11 ENCOUNTER — Other Ambulatory Visit: Payer: Self-pay

## 2022-08-11 DIAGNOSIS — I483 Typical atrial flutter: Secondary | ICD-10-CM

## 2022-08-11 MED ORDER — METOPROLOL SUCCINATE ER 200 MG PO TB24: 200.0000 mg | ORAL_TABLET | Freq: Two times a day (BID) | ORAL | 3 refills | Status: DC

## 2022-10-16 ENCOUNTER — Encounter: Payer: Self-pay | Admitting: Cardiology

## 2022-10-16 ENCOUNTER — Ambulatory Visit: Payer: BC Managed Care – PPO | Admitting: Cardiology

## 2022-10-16 VITALS — BP 150/90 | HR 87 | Temp 98.1°F | Resp 16 | Ht 70.0 in | Wt 358.0 lb

## 2022-10-16 DIAGNOSIS — I10 Essential (primary) hypertension: Secondary | ICD-10-CM

## 2022-10-16 DIAGNOSIS — I483 Typical atrial flutter: Secondary | ICD-10-CM

## 2022-10-16 DIAGNOSIS — Z6841 Body Mass Index (BMI) 40.0 and over, adult: Secondary | ICD-10-CM | POA: Diagnosis not present

## 2022-10-16 MED ORDER — METOPROLOL TARTRATE 100 MG PO TABS: 100.0000 mg | ORAL_TABLET | Freq: Two times a day (BID) | ORAL | 3 refills | Status: DC

## 2022-10-16 MED ORDER — AMLODIPINE BESYLATE 10 MG PO TABS: 10.0000 mg | ORAL_TABLET | Freq: Every day | ORAL | 3 refills | Status: DC

## 2022-10-16 MED ORDER — SPIRONOLACTONE 25 MG PO TABS: 25.0000 mg | ORAL_TABLET | ORAL | 3 refills | Status: DC

## 2022-10-16 NOTE — Patient Instructions (Signed)
Please do not forget to get blood work in 2 to 3 weeks, he can go to any [facility].  We will see you back in 6 weeks.

## 2022-10-16 NOTE — Progress Notes (Signed)
Primary Physician/Referring:  Deland Pretty, MD  Patient ID: Leslie Soto, male    DOB: [birth year], 61 y.o.   MRN: [id number]  Chief Complaint  Patient presents with   Hypertension   Atrial Flutter   Follow-up    6 months   HPI:    Leslie Soto  is a 61 y.o. Caucasian male patient  with past medical history of morbid obesity, hypertension, obstructive sleep apnea on CPAP therapy and has had recurrent episodes of nephrolithiasis admitted to the hospital on 02/06/2022 with new onset typical atrial flutter with RVR.  Underwent TEE guided cardioversion and discharged 2 days later on Eliquis.  He has been evaluated by EP and his CHA2DS2-VASc score is 1.0, in view of his excess weight, EP study was not offered unless he loses weight.  He continues to tolerate anticoagulation without bleeding diathesis but is wondering if he can come off of it.  Denies dyspnea, no PND or orthopnea or leg edema.  States that he is doing well and remains asymptomatic.  Compliant with CPAP.  Past Medical History:  Diagnosis Date   Abnormal EKG    hx right bundle branck block on ekg   Bronchial asthma 02/18/2019   Degenerative disc disease    ?   Gastric ulcer with hemorrhage 08/28/2012   GI bleed due to NSAIDs    History of kidney stones    Hyperlipidemia    Lower back pain    Obesity (BMI 35.0-39.9 without comorbidity)    OSA (obstructive sleep apnea)    Dr. Vertell Limber, [place]-uses cpap at night  set on 10-8   Palpitations 02/18/2019   Pneumonia 8 years ago   walking pneumonia    Right bundle branch block (RBBB) 02/18/2019   Shortness of breath 02/18/2019   Spina bifida ([place])    L 2 or L 3 defect creating left leg pain     Social History   Tobacco Use   Smoking status: Never   Smokeless tobacco: Never  Substance Use Topics   Alcohol use: No   Marital Status: Widowed  ROS  Review of Systems  Cardiovascular:  Positive for dyspnea on exertion (stable). Negative for chest pain, leg swelling,  palpitations and syncope.   Objective  Blood pressure (!) 150/90, pulse 87, temperature 98.1 F (36.7 C), temperature source Temporal, resp. rate 16, height _0  (1.778 m), weight (!) 358 lb (162.4 kg), SpO2 96 %. Body mass index is 51.37 kg/m.   Vitals:   10/16/22 1108 10/16/22 1114  BP: (!) 155/95 (!) 150/90  Pulse: 87   Temp: 98.1 F (36.7 C)   Resp: 16   Height: _1  (1.778 m)   Weight: (!) 358 lb (162.4 kg)   SpO2: 96%   TempSrc: Temporal   BMI (Calculated): 51.37      Physical Exam Vitals reviewed.  Constitutional:      Appearance: He is obese.  Neck:     Vascular: No JVD.  Cardiovascular:     Rate and Rhythm: Normal rate and regular rhythm.     Pulses: Intact distal pulses.     Heart sounds: Normal heart sounds. No murmur heard.    No gallop.  Pulmonary:     Effort: Pulmonary effort is normal.     Breath sounds: Normal breath sounds.  Abdominal:     General: Bowel sounds are normal.     Palpations: Abdomen is soft.  Musculoskeletal:     Right lower leg: No edema.  Left lower leg: No edema.     Allergies  No Known Allergies   Medications    Current Outpatient Medications:    atorvastatin (LIPITOR) 10 MG tablet, Take 1 tablet by mouth in the morning and 1/2 tablet in the evening, Disp: , Rfl:    flecainide (TAMBOCOR) 50 MG tablet, Take 1 tablet (50 mg total) by mouth 2 (two) times daily., Disp: 180 tablet, Rfl: 3   fluocinonide cream (LIDEX) 1.01 %, Apply 1 application. topically as needed., Disp: , Rfl:    Glucos-MSM-C-Mn-Ginger-Willow (GLUCOSAMINE MSM COMPLEX) TABS, Take 1 tablet by mouth daily., Disp: , Rfl:    ketoconazole (NIZORAL) 2 % cream, Apply 1 application. topically as needed., Disp: , Rfl:    Magnesium 500 MG CAPS, Take 500 mg by mouth every other day., Disp: , Rfl:    metoprolol tartrate (LOPRESSOR) 100 MG tablet, Take 1 tablet (100 mg total) by mouth 2 (two) times daily., Disp: 190 tablet, Rfl: 3   spironolactone (ALDACTONE) 25 MG  tablet, Take 1 tablet (25 mg total) by mouth every morning., Disp: 90 tablet, Rfl: 3   Turmeric-Ginger 150-25 MG CHEW, Chew 1 capsule by mouth daily., Disp: , Rfl:    valsartan-hydrochlorothiazide (DIOVAN-HCT) 320-12.5 MG tablet, Take 1 tablet by mouth daily., Disp: , Rfl:    amLODipine (NORVASC) 10 MG tablet, Take 1 tablet (10 mg total) by mouth daily., Disp: 90 tablet, Rfl: 3   Laboratory examination:   Recent Labs    02/06/22 1210 02/07/22 0637 02/09/22 0624  NA 139 138 137  K 3.2* 3.8 4.3  CL 111 109 109  CO2 18* 21* 22  GLUCOSE 116* 110* 111*  Soto 7* 10 17  CREATININE 0.77 0.85 1.10  CALCIUM 8.7* 8.7* 8.9  GFRNONAA >60 >60 >60      Latest Ref Rng & Units 02/06/2022   12:10 PM 08/26/2012    8:47 PM  Hepatic Function  Total Protein 6.5 - 8.1 g/dL 6.6  6.6   Albumin 3.5 - 5.0 g/dL 3.7  3.6   AST 15 - 41 U/L 22  13   ALT 0 - 44 U/L 21  19   Alk Phosphatase 38 - 126 U/L 72  74   Total Bilirubin 0.3 - 1.2 mg/dL 1.5  0.7    Lab Results  Component Value Date   WBC 10.2 02/09/2022   HGB 14.2 02/09/2022   HCT 42.4 02/09/2022   MCV 92.6 02/09/2022   PLT 154 02/09/2022   TSH Recent Labs    02/06/22 1739  TSH 1.090   BNP (last 3 results) Recent Labs    02/06/22 1739  BNP 212.6*   External labs:   Labs 02/06/2022:  Sodium 142, potassium 3.5, Soto 8, creatinine 0.86, EGFR 92 mL, potassium 3.5.  Hb 15.6/HCT 45.3, platelets 191.  Total cholesterol 111, triglycerides 121, HDL 28, LDL 59.  A1c 6.2%.  Radiology:   [facility] Chest Port 1 View  02/06/2022 CLINICAL DATA:  Tachycardia EXAM: PORTABLE CHEST 1 VIEW COMPARISON:  CT examination dated November 05, 2021 FINDINGS: The heart size and mediastinal contours are within normal limits. Low lung volumes with left basilar atelectasis. No focal consolidation or large pleural effusion. The visualized skeletal structures are unremarkable. IMPRESSION: Low lung volumes with left basilar atelectasis. No focal consolidation or large  pleural effusion. Electronically Signed   By: Keane Police D.O.   On: 02/06/2022 12:25    Cardiac Studies:   [facility] ECHOCARDIOGRAM COMPLETE 75/09/2584 Normal LV systolic function  with visual EF 50-55%. Left ventricle cavity is normal in size. Mild to Moderate left ventricular hypertrophy. Normal global wall motion. Indeterminate diastolic filling pattern, elevated LAP. Mild (Grade I) mitral regurgitation. Mild tricuspid regurgitation. The aortic root is mildly dilated. Proximal ascending aorta not well visualized. Compared to prior study dated 04/04/2017 Mild MR and TR are new, aortic dilatation is new.    [facility] MYOCARDIAL PERFUSION WITH LEXISCAN 04/12/2020 Lexiscan stress. Patient achieved THR with mod Bruce protocol. No ST-T changes of ischemia. The heart rate response was accelerated. The baseline blood pressure was 198/110 mmHg. Maximum blood pressure post injection was 238/118 mmHg. Hypertensive throughout. Myocardial perfusion is normal. Overall LV systolic function is abnormal without regional wall motion abnormalities. Stress LV EF: 44%.  Findings suggest non ischemic cardiomyopathy. No previous exam available for comparison.  Intermediate risk.  TEE guided direct-current cardioversion 02/08/2022:  1. Left ventricular ejection fraction, by estimation, is 40 to 45%. Left ventricular ejection fraction by PLAX is 45 %. The left ventricle has mildly decreased function. The left ventricle demonstrates global hypokinesis. Left ventricular diastolic  function could not be evaluated.  2. Right ventricular systolic function is normal. The right ventricular size is normal. There is normal pulmonary artery systolic pressure.  3. Small LAA. No thrombus and normal flow velocity.  4. The mitral valve is normal in structure. Trivial mitral valve regurgitation. No evidence of mitral stenosis.  5. The aortic valve is normal in structure. Aortic valve regurgitation is not visualized. No aortic stenosis is  present.  Under MAC anesthesia, 100 J of electricity was delivered in a synchronized fashion, patient converted from typical atrial flutter to normal sinus rhythm with occasional PACs.  Patient tolerated the procedure well.  No immediate complication.   EKG:   EKG 10/16/2022: Normal sinus rhythm at rate of 81 bpm, left axis deviation, left anterior fascicular block.  Right bundle branch block.  Low-voltage complexes.  Pulmonary disease pattern.  Compared to 04/14/2022, no change.  EKG 02/06/2022 and also 02/07/2022: Typical atrial flutter with variable AV conduction with nonspecific T abnormality.  Assessment     ICD-10-CM   1. Typical atrial flutter (HCC)  I48.3 EKG 12-Lead    metoprolol tartrate (LOPRESSOR) 100 MG tablet    2. Primary hypertension  I10 amLODipine (NORVASC) 10 MG tablet    spironolactone (ALDACTONE) 25 MG tablet    Basic metabolic panel    metoprolol tartrate (LOPRESSOR) 100 MG tablet    3. Class 3 severe obesity due to excess calories with serious comorbidity and body mass index (BMI) of 50.0 to 59.9 in adult (HCC)  E66.01    Z68.43       CHA2DS2-VASc Score is 1.  Yearly risk of stroke: 1.3% (HTN).  Score of 1=0.6; 2=2.2; 3=3.2; 4=4.8; 5=7.2; 6=9.8; 7=>9.8) -(CHF; HTN; vasc disease DM,  Male = 1; Age <65 =0; 65-74 = 1,  >75 =2; stroke/embolism= 2).    Medications Discontinued During This Encounter  Medication Reason   ELIQUIS 5 MG TABS tablet Patient Preference   metoprolol (TOPROL-XL) 200 MG 24 hr tablet Change in therapy   amLODipine (NORVASC) 10 MG tablet Reorder    Meds ordered this encounter  Medications   amLODipine (NORVASC) 10 MG tablet    Sig: Take 1 tablet (10 mg total) by mouth daily.    Dispense:  90 tablet    Refill:  3   spironolactone (ALDACTONE) 25 MG tablet    Sig: Take 1 tablet (25 mg total) by mouth  every morning.    Dispense:  90 tablet    Refill:  3   metoprolol tartrate (LOPRESSOR) 100 MG tablet    Sig: Take 1 tablet (100 mg total)  by mouth 2 (two) times daily.    Dispense:  190 tablet    Refill:  3   Orders Placed This Encounter  Procedures   Basic metabolic panel   EKG [id number]   Recommendations:   Leslie Soto is a 61 y.o. Caucasian male patient  with past medical history of morbid obesity, hypertension, obstructive sleep apnea on CPAP therapy and has had recurrent episodes of nephrolithiasis admitted to the hospital on 02/06/2022 with new onset typical atrial flutter with RVR.  Underwent TEE guided cardioversion and discharged 2 days later on Eliquis.  He has been evaluated by EP and his CHA2DS2-VASc score is 1.0, in view of his excess weight, EP study was not offered unless he loses weight.  In view of low chads vascular score, I will discontinue Eliquis.  He is presently on flecainide, I will continue this for now and may consider discontinuing this at some point down the line or continue this indefinitely for now.  If he were to lose weight, he would certainly be a good candidate for atrial flutter ablation.  His blood pressure is markedly elevated today, will add spironolactone 25 mg in the morning and obtain BMP in 2 weeks.  He has been compliant with his CPAP.  Lipids are well controlled.  He does have hyperglycemia that has remained stable.  EKG reveals maintenance of sinus rhythm.  QT interval is normal.  I will see him back in 6 weeks for follow-up.    Adrian Prows, MD, [facility] 10/16/2022, 11:53 AM Office: [phone] Fax: [phone] Pager: [phone]

## 2022-10-26 DIAGNOSIS — I10 Essential (primary) hypertension: Secondary | ICD-10-CM | POA: Diagnosis not present

## 2022-10-27 LAB — BASIC METABOLIC PANEL
BUN/Creatinine Ratio: 14 (ref 10–24)
BUN: 11 mg/dL (ref 8–27)
CO2: 17 mmol/L — ABNORMAL LOW (ref 20–29)
Calcium: 8.7 mg/dL (ref 8.6–10.2)
Chloride: 107 mmol/L — ABNORMAL HIGH (ref 96–106)
Creatinine, Ser: 0.81 mg/dL (ref 0.76–1.27)
Glucose: 185 mg/dL — ABNORMAL HIGH (ref 70–99)
Potassium: 4.4 mmol/L (ref 3.5–5.2)
Sodium: 139 mmol/L (ref 134–144)
eGFR: 99 mL/min/{1.73_m2} (ref >59)

## 2022-11-16 DIAGNOSIS — N200 Calculus of kidney: Secondary | ICD-10-CM | POA: Diagnosis not present

## 2022-11-17 DIAGNOSIS — R109 Unspecified abdominal pain: Secondary | ICD-10-CM | POA: Diagnosis not present

## 2022-11-17 DIAGNOSIS — Z8042 Family history of malignant neoplasm of prostate: Secondary | ICD-10-CM | POA: Diagnosis not present

## 2022-11-17 DIAGNOSIS — R310 Gross hematuria: Secondary | ICD-10-CM | POA: Diagnosis not present

## 2022-11-17 DIAGNOSIS — N200 Calculus of kidney: Secondary | ICD-10-CM | POA: Diagnosis not present

## 2022-11-22 DIAGNOSIS — I700 Atherosclerosis of aorta: Secondary | ICD-10-CM | POA: Diagnosis not present

## 2022-11-22 DIAGNOSIS — K5730 Diverticulosis of large intestine without perforation or abscess without bleeding: Secondary | ICD-10-CM | POA: Diagnosis not present

## 2022-11-22 DIAGNOSIS — R310 Gross hematuria: Secondary | ICD-10-CM | POA: Diagnosis not present

## 2022-11-22 DIAGNOSIS — N202 Calculus of kidney with calculus of ureter: Secondary | ICD-10-CM | POA: Diagnosis not present

## 2022-11-29 ENCOUNTER — Ambulatory Visit: Payer: BC Managed Care – PPO | Admitting: Cardiology

## 2023-02-01 ENCOUNTER — Ambulatory Visit: Payer: BC Managed Care – PPO | Admitting: Cardiology

## 2023-02-01 ENCOUNTER — Encounter: Payer: Self-pay | Admitting: Cardiology

## 2023-02-01 VITALS — BP 130/80 | HR 70 | Resp 16 | Ht 70.0 in | Wt 358.0 lb

## 2023-02-01 DIAGNOSIS — I10 Essential (primary) hypertension: Secondary | ICD-10-CM | POA: Diagnosis not present

## 2023-02-01 DIAGNOSIS — Z6841 Body Mass Index (BMI) 40.0 and over, adult: Secondary | ICD-10-CM | POA: Diagnosis not present

## 2023-02-01 DIAGNOSIS — I483 Typical atrial flutter: Secondary | ICD-10-CM | POA: Diagnosis not present

## 2023-02-01 MED ORDER — LABETALOL HCL 300 MG PO TABS: 300.0000 mg | ORAL_TABLET | Freq: Two times a day (BID) | ORAL | 2 refills | Status: DC

## 2023-02-01 NOTE — Progress Notes (Addendum)
Primary Physician/Referring:  Deland Pretty, MD  Patient ID: Leslie Soto, male    DOB: 09/16/1961, 62 y.o.   MRN: [id number]  Chief Complaint  Patient presents with   Hypertension   Follow-up    6 weeks   HPI:    Leslie Soto  is a 62 y.o. Caucasian male patient  with past medical history of morbid obesity, hypertension, obstructive sleep apnea on CPAP therapy and has had recurrent episodes of nephrolithiasis admitted to the hospital on 02/06/2022 with new onset typical atrial flutter with RVR.  Underwent TEE guided cardioversion.  He has been evaluated by EP and his CHA2DS2-VASc score is 1.0, Eliquis discontinued and in view of his excess weight, EP study was not offered unless he loses weight and was started on Flecainide.  He presents for a 6-week follow-up of hypertension.  Denies dyspnea, no PND or orthopnea or leg edema.  States that he is doing well and remains asymptomatic.  Compliant with CPAP.  Past Medical History:  Diagnosis Date   Abnormal EKG    hx right bundle branck block on ekg   Bronchial asthma 02/18/2019   Degenerative disc disease    ?   Gastric ulcer with hemorrhage 08/28/2012   GI bleed due to NSAIDs    History of kidney stones    Hyperlipidemia    Lower back pain    Obesity (BMI 35.0-39.9 without comorbidity)    OSA (obstructive sleep apnea)    Dr. Vertell Limber, [facility]-uses cpap at night  set on 10-8   Palpitations 02/18/2019   Pneumonia 8 years ago   walking pneumonia    Right bundle branch block (RBBB) 02/18/2019   Shortness of breath 02/18/2019   Spina bifida ([place])    L 2 or L 3 defect creating left leg pain     Social History   Tobacco Use   Smoking status: Never   Smokeless tobacco: Never  Substance Use Topics   Alcohol use: No   Marital Status: Widowed  ROS  Review of Systems  Cardiovascular:  Positive for dyspnea on exertion (stable). Negative for chest pain, leg swelling, palpitations and syncope.   Objective  Blood pressure  130/80, pulse 70, resp. rate 16, height 5' 10"$  (1.778 m), weight (!) 358 lb (162.4 kg), SpO2 94 %. Body mass index is 51.37 kg/m.   Vitals:   02/01/23 1046  BP: 130/80  Pulse: 70  Resp: 16  Height: 5' 10"$  (1.778 m)  Weight: (!) 358 lb (162.4 kg)  SpO2: 94%  BMI (Calculated): 51.37     Physical Exam Vitals reviewed.  Constitutional:      Appearance: He is obese.  Neck:     Vascular: No JVD.  Cardiovascular:     Rate and Rhythm: Normal rate and regular rhythm.     Pulses: Intact distal pulses.     Heart sounds: Normal heart sounds. No murmur heard.    No gallop.  Pulmonary:     Effort: Pulmonary effort is normal.     Breath sounds: Normal breath sounds.  Abdominal:     General: Bowel sounds are normal.     Palpations: Abdomen is soft.  Musculoskeletal:     Right lower leg: No edema.     Left lower leg: No edema.     Allergies  No Known Allergies   Medications    Current Outpatient Medications:    amLODipine (NORVASC) 10 MG tablet, Take 1 tablet (10 mg total) by mouth daily., Disp:  90 tablet, Rfl: 3   atorvastatin (LIPITOR) 10 MG tablet, Take 1 tablet by mouth in the morning and 1/2 tablet in the evening, Disp: , Rfl:    flecainide (TAMBOCOR) 50 MG tablet, Take 1 tablet (50 mg total) by mouth 2 (two) times daily., Disp: 180 tablet, Rfl: 3   fluocinonide cream (LIDEX) [id number] %, Apply 1 application. topically as needed., Disp: , Rfl:    Glucos-MSM-C-Mn-Ginger-Willow (GLUCOSAMINE MSM COMPLEX) TABS, Take 1 tablet by mouth daily., Disp: , Rfl:    ketoconazole (NIZORAL) 2 % cream, Apply 1 application. topically as needed., Disp: , Rfl:    labetalol (NORMODYNE) 300 MG tablet, Take 1 tablet (300 mg total) by mouth 2 (two) times daily., Disp: 60 tablet, Rfl: 2   Magnesium 500 MG CAPS, Take 500 mg by mouth every other day., Disp: , Rfl:    spironolactone (ALDACTONE) 25 MG tablet, Take 1 tablet (25 mg total) by mouth every morning., Disp: 90 tablet, Rfl: 3   Turmeric-Ginger 150-25  MG CHEW, Chew 1 capsule by mouth daily., Disp: , Rfl:    valsartan-hydrochlorothiazide (DIOVAN-HCT) 320-12.5 MG tablet, Take 1 tablet by mouth daily., Disp: , Rfl:    Laboratory examination:   Recent Labs    02/06/22 1210 02/07/22 0637 02/09/22 0624 10/26/22 1055  NA 139 138 137 139  K 3.2* 3.8 4.3 4.4  CL 111 109 109 107*  CO2 18* 21* 22 17*  GLUCOSE 116* 110* 111* 185*  Soto 7* 10 17 11  $ CREATININE 0.77 0.85 1.10 0.81  CALCIUM 8.7* 8.7* 8.9 8.7  GFRNONAA >60 >60 >60  --       Latest Ref Rng & Units 02/06/2022   12:10 PM 08/26/2012    8:47 PM  Hepatic Function  Total Protein 6.5 - 8.1 g/dL 6.6  6.6   Albumin 3.5 - 5.0 g/dL 3.7  3.6   AST 15 - 41 U/L 22  13   ALT 0 - 44 U/L 21  19   Alk Phosphatase 38 - 126 U/L 72  74   Total Bilirubin 0.3 - 1.2 mg/dL 1.5  0.7    Lab Results  Component Value Date   WBC 10.2 02/09/2022   HGB 14.2 02/09/2022   HCT 42.4 02/09/2022   MCV 92.6 02/09/2022   PLT 154 02/09/2022   TSH Recent Labs    02/06/22 1739  TSH 1.090   BNP (last 3 results) Recent Labs    02/06/22 1739  BNP 212.6*   External labs:   Labs 02/06/2022:  Sodium 142, potassium 3.5, Soto 8, creatinine 0.86, EGFR 92 mL, potassium 3.5.  Hb 15.6/HCT 45.3, platelets 191.  Total cholesterol 111, triglycerides 121, HDL 28, LDL 59.  A1c 6.2%.  Radiology:   [facility] Chest Port 1 View  02/06/2022 CLINICAL DATA:  Tachycardia EXAM: PORTABLE CHEST 1 VIEW COMPARISON:  CT examination dated November 05, 2021 FINDINGS: The heart size and mediastinal contours are within normal limits. Low lung volumes with left basilar atelectasis. No focal consolidation or large pleural effusion. The visualized skeletal structures are unremarkable. IMPRESSION: Low lung volumes with left basilar atelectasis. No focal consolidation or large pleural effusion. Electronically Signed   By: Keane Police D.O.   On: 02/06/2022 12:25    Cardiac Studies:   PCV ECHOCARDIOGRAM COMPLETE 04/20/2020 Normal LV  systolic function with visual EF 50-55%. Left ventricle cavity is normal in size. Mild to Moderate left ventricular hypertrophy. Normal global wall motion. Indeterminate diastolic filling pattern, elevated LAP. Mild (Grade  I) mitral regurgitation. Mild tricuspid regurgitation. The aortic root is mildly dilated. Proximal ascending aorta not well visualized. Compared to prior study dated 04/04/2017 Mild MR and TR are new, aortic dilatation is new.    PCV MYOCARDIAL PERFUSION WITH LEXISCAN 04/12/2020 Lexiscan stress. Patient achieved THR with mod Bruce protocol. No ST-T changes of ischemia. The heart rate response was accelerated. The baseline blood pressure was 198/110 mmHg. Maximum blood pressure post injection was 238/118 mmHg. Hypertensive throughout. Myocardial perfusion is normal. Overall LV systolic function is abnormal without regional wall motion abnormalities. Stress LV EF: 44%.  Findings suggest non ischemic cardiomyopathy. No previous exam available for comparison.  Intermediate risk.  TEE guided direct-current cardioversion 02/08/2022:  1. Left ventricular ejection fraction, by estimation, is 40 to 45%. Left ventricular ejection fraction by PLAX is 45 %. The left ventricle has mildly decreased function. The left ventricle demonstrates global hypokinesis. Left ventricular diastolic  function could not be evaluated.  2. Right ventricular systolic function is normal. The right ventricular size is normal. There is normal pulmonary artery systolic pressure.  3. Small LAA. No thrombus and normal flow velocity.  4. The mitral valve is normal in structure. Trivial mitral valve regurgitation. No evidence of mitral stenosis.  5. The aortic valve is normal in structure. Aortic valve regurgitation is not visualized. No aortic stenosis is present.  Under MAC anesthesia, 100 J of electricity was delivered in a synchronized fashion, patient converted from typical atrial flutter to normal sinus rhythm  with occasional PACs.  Patient tolerated the procedure well.  No immediate complication.   EKG:   EKG 10/16/2022: Normal sinus rhythm at rate of 81 bpm, left axis deviation, left anterior fascicular block.  Right bundle branch block.  Low-voltage complexes.  Pulmonary disease pattern.  Compared to 04/14/2022, no change.  EKG 02/06/2022 and also 02/07/2022: Typical atrial flutter with variable AV conduction with nonspecific T abnormality.  Assessment     ICD-10-CM   1. Essential hypertension  I10 labetalol (NORMODYNE) 300 MG tablet    2. Typical atrial flutter (HCC)  I48.3     3. Class 3 severe obesity due to excess calories with serious comorbidity and body mass index (BMI) of 50.0 to 59.9 in adult (HCC)  E66.01    Z68.43       CHA2DS2-VASc Score is 1.  Yearly risk of stroke: 1.3% (HTN).  Score of 1=0.6; 2=2.2; 3=3.2; 4=4.8; 5=7.2; 6=9.8; 7=>9.8) -(CHF; HTN; vasc disease DM,  Male = 1; Age <65 =0; 65-74 = 1,  >75 =2; stroke/embolism= 2).    Medications Discontinued During This Encounter  Medication Reason   metoprolol tartrate (LOPRESSOR) 100 MG tablet Change in therapy     Meds ordered this encounter  Medications   labetalol (NORMODYNE) 300 MG tablet    Sig: Take 1 tablet (300 mg total) by mouth 2 (two) times daily.    Dispense:  60 tablet    Refill:  2    Discontinue metoprolol   No orders of the defined types were placed in this encounter.  Recommendations:   Leslie Soto is a 62 y.o. Caucasian male patient  with past medical history of morbid obesity, hypertension, obstructive sleep apnea on CPAP therapy and has had recurrent episodes of nephrolithiasis admitted to the hospital on 02/06/2022 with new onset typical atrial flutter with RVR.  Underwent TEE guided cardioversion.  He has been evaluated by EP and his CHA2DS2-VASc score is 1.0, Eliquis discontinued and in view of his excess weight,  EP study was not offered unless he loses weight and was started on  Flecainide.  1. Essential hypertension He is now tolerating spironolactone, blood pressure has improved significantly. He has been compliant with his CPAP.  His blood pressure was markedly elevated when he initially came into our office, overall doing well, I would like to see if I can discontinue metoprolol and switch him to labetalol and see if his blood pressure would remain stable and also would help with weight loss as beta-blockers can certainly cause increased weight gain.  2. Typical atrial flutter ([place]) He is presently on flecainide, I will continue this for now and may consider discontinuing this at some point down the line or continue this indefinitely for now.  If he were to lose weight, he would certainly be a good candidate for atrial flutter ablation.   He is not on anticoagulation.  3. Class 3 severe obesity due to excess calories with serious comorbidity and body mass index (BMI) of 50.0 to 59.9 in adult [facility]) Patient is trying his best but has had difficulty in weight loss.  He is following up with his PCP for weight loss management soon.  Could consider Wegovy.  I will see him back in 6 months for follow-up.   Adrian Prows, MD, [facility] 02/01/2023, 11:11 AM Office: [phone] Fax: [phone] Pager: [phone]

## 2023-03-07 DIAGNOSIS — Z0001 Encounter for general adult medical examination with abnormal findings: Secondary | ICD-10-CM | POA: Diagnosis not present

## 2023-03-07 DIAGNOSIS — Z125 Encounter for screening for malignant neoplasm of prostate: Secondary | ICD-10-CM | POA: Diagnosis not present

## 2023-03-07 DIAGNOSIS — R7309 Other abnormal glucose: Secondary | ICD-10-CM | POA: Diagnosis not present

## 2023-03-14 DIAGNOSIS — Z23 Encounter for immunization: Secondary | ICD-10-CM | POA: Diagnosis not present

## 2023-03-14 DIAGNOSIS — G4733 Obstructive sleep apnea (adult) (pediatric): Secondary | ICD-10-CM | POA: Diagnosis not present

## 2023-03-14 DIAGNOSIS — R7309 Other abnormal glucose: Secondary | ICD-10-CM | POA: Diagnosis not present

## 2023-03-14 DIAGNOSIS — E559 Vitamin D deficiency, unspecified: Secondary | ICD-10-CM | POA: Diagnosis not present

## 2023-03-14 DIAGNOSIS — Z0000 Encounter for general adult medical examination without abnormal findings: Secondary | ICD-10-CM | POA: Diagnosis not present

## 2023-03-14 DIAGNOSIS — I10 Essential (primary) hypertension: Secondary | ICD-10-CM | POA: Diagnosis not present

## 2023-03-23 ENCOUNTER — Telehealth: Payer: Self-pay

## 2023-03-23 DIAGNOSIS — I483 Typical atrial flutter: Secondary | ICD-10-CM

## 2023-03-23 MED ORDER — FLECAINIDE ACETATE 50 MG PO TABS: 50.0000 mg | ORAL_TABLET | Freq: Two times a day (BID) | ORAL | 3 refills | Status: DC

## 2023-03-23 NOTE — Telephone Encounter (Signed)
Can you refill his Flecainide, or do you want me to?

## 2023-03-23 NOTE — Telephone Encounter (Signed)
Yes for    flecainide (TAMBOCOR) 50 MG tablet, Take 1 tablet (50 mg total) by mouth 2 (two) times daily., Disp: 180 tablet, Rfl: 3 No for diltiazem

## 2023-03-23 NOTE — Telephone Encounter (Signed)
Patient called and wants to know if he should be taking:  Diltiazem Flecainide  He has these bottles at home and wants to make he is not supposed to be taking them. I did let him know that only the Flecainide was on his medication list. Please advise.

## 2023-04-04 ENCOUNTER — Other Ambulatory Visit: Payer: Self-pay | Admitting: Cardiology

## 2023-04-04 DIAGNOSIS — I483 Typical atrial flutter: Secondary | ICD-10-CM

## 2023-06-15 DIAGNOSIS — R7309 Other abnormal glucose: Secondary | ICD-10-CM | POA: Diagnosis not present

## 2023-06-15 DIAGNOSIS — E559 Vitamin D deficiency, unspecified: Secondary | ICD-10-CM | POA: Diagnosis not present

## 2023-06-26 DIAGNOSIS — E785 Hyperlipidemia, unspecified: Secondary | ICD-10-CM | POA: Diagnosis not present

## 2023-06-26 DIAGNOSIS — I10 Essential (primary) hypertension: Secondary | ICD-10-CM | POA: Diagnosis not present

## 2023-06-26 DIAGNOSIS — I2510 Atherosclerotic heart disease of native coronary artery without angina pectoris: Secondary | ICD-10-CM | POA: Diagnosis not present

## 2023-06-26 DIAGNOSIS — Z23 Encounter for immunization: Secondary | ICD-10-CM | POA: Diagnosis not present

## 2023-08-02 ENCOUNTER — Ambulatory Visit: Payer: BC Managed Care – PPO | Admitting: Cardiology

## 2023-08-06 ENCOUNTER — Other Ambulatory Visit: Payer: Self-pay | Admitting: Cardiology

## 2023-08-06 DIAGNOSIS — I10 Essential (primary) hypertension: Secondary | ICD-10-CM

## 2023-08-17 DIAGNOSIS — N62 Hypertrophy of breast: Secondary | ICD-10-CM | POA: Diagnosis not present

## 2023-08-17 DIAGNOSIS — I10 Essential (primary) hypertension: Secondary | ICD-10-CM | POA: Diagnosis not present

## 2023-08-17 DIAGNOSIS — Z23 Encounter for immunization: Secondary | ICD-10-CM | POA: Diagnosis not present

## 2023-08-27 ENCOUNTER — Ambulatory Visit: Payer: BC Managed Care – PPO | Admitting: Cardiology

## 2023-08-28 ENCOUNTER — Other Ambulatory Visit: Payer: Self-pay | Admitting: Internal Medicine

## 2023-08-28 DIAGNOSIS — N63 Unspecified lump in unspecified breast: Secondary | ICD-10-CM

## 2023-08-29 ENCOUNTER — Other Ambulatory Visit: Payer: Self-pay | Admitting: Internal Medicine

## 2023-08-29 DIAGNOSIS — N63 Unspecified lump in unspecified breast: Secondary | ICD-10-CM

## 2023-09-10 ENCOUNTER — Encounter: Payer: Self-pay | Admitting: Cardiology

## 2023-09-26 ENCOUNTER — Other Ambulatory Visit: Payer: BC Managed Care – PPO

## 2023-09-27 ENCOUNTER — Ambulatory Visit: Payer: Self-pay | Admitting: Cardiology

## 2023-10-05 ENCOUNTER — Ambulatory Visit
Admission: RE | Admit: 2023-10-05 | Discharge: 2023-10-05 | Disposition: A | Discharge status: Home / Self Care | Payer: BC Managed Care – PPO | Source: Ambulatory Visit | Attending: Internal Medicine | Admitting: Internal Medicine

## 2023-10-05 ENCOUNTER — Ambulatory Visit: Payer: BC Managed Care – PPO

## 2023-10-05 DIAGNOSIS — N62 Hypertrophy of breast: Secondary | ICD-10-CM | POA: Diagnosis not present

## 2023-10-05 DIAGNOSIS — N63 Unspecified lump in unspecified breast: Secondary | ICD-10-CM

## 2023-10-11 DIAGNOSIS — M1712 Unilateral primary osteoarthritis, left knee: Secondary | ICD-10-CM | POA: Diagnosis not present

## 2023-10-16 ENCOUNTER — Other Ambulatory Visit: Payer: BC Managed Care – PPO

## 2023-11-01 ENCOUNTER — Other Ambulatory Visit: Payer: Self-pay | Admitting: Cardiology

## 2023-11-01 DIAGNOSIS — I10 Essential (primary) hypertension: Secondary | ICD-10-CM

## 2023-11-01 DIAGNOSIS — I483 Typical atrial flutter: Secondary | ICD-10-CM

## 2023-11-07 ENCOUNTER — Other Ambulatory Visit: Payer: Self-pay | Admitting: Cardiology

## 2023-11-07 DIAGNOSIS — I10 Essential (primary) hypertension: Secondary | ICD-10-CM

## 2023-11-09 ENCOUNTER — Other Ambulatory Visit: Payer: Self-pay | Admitting: Cardiology

## 2023-11-09 DIAGNOSIS — I10 Essential (primary) hypertension: Secondary | ICD-10-CM

## 2023-11-25 NOTE — Progress Notes (Unsigned)
Cardiology Office Note:  .   Date:  11/25/2023  ID:  Leslie Soto, DOB 07-08-1961, MRN [id number] PCP: Merri Brunette, MD  [facility] Providers Cardiologist:  None { Click to update primary MD,subspecialty MD or APP then REFRESH:1}  History of Present Illness: Leslie Soto is a 62 y.o. Caucasian male patient  with past medical history of morbid obesity, hypertension, obstructive sleep apnea on CPAP therapy and has had recurrent episodes of nephrolithiasis admitted to the hospital on 02/06/2022 with new onset typical atrial flutter with RVR.  Underwent TEE guided cardioversion.  He has been evaluated by EP and his CHA2DS2-VASc score is 1.0, Eliquis discontinued and in view of his excess weight, EP study was not offered unless he loses weight and was started on Flecainide.   Discussed the use of AI scribe software for clinical note transcription with the patient, who gave verbal consent to proceed.  History of Present Illness            ROS  Labs   No results found for: "CHOL", "HDL", "LDLCALC", "LDLDIRECT", "TRIG", "CHOLHDL" Lab Results  Component Value Date   NA 139 10/26/2022   K 4.4 10/26/2022   CO2 17 (L) 10/26/2022   GLUCOSE 185 (H) 10/26/2022   BUN 11 10/26/2022   CREATININE 0.81 10/26/2022   CALCIUM 8.7 10/26/2022   EGFR 99 10/26/2022   GFRNONAA >60 02/09/2022      Latest Ref Rng & Units 10/26/2022   10:55 AM 02/09/2022    6:24 AM 02/07/2022    6:37 AM  BMP  Glucose 70 - 99 mg/dL 308  657  846   BUN 8 - 27 mg/dL 11  17  10    Creatinine 0.76 - 1.27 mg/dL 9.62  9.52  8.41   BUN/Creat Ratio 10 - 24 14     Sodium 134 - 144 mmol/L 139  137  138   Potassium 3.5 - 5.2 mmol/L 4.4  4.3  3.8   Chloride 96 - 106 mmol/L 107  109  109   CO2 20 - 29 mmol/L 17  22  21    Calcium 8.6 - 10.2 mg/dL 8.7  8.9  8.7       Latest Ref Rng & Units 02/09/2022    6:24 AM 02/06/2022   12:10 PM 11/05/2021    7:42 AM  CBC  WBC 4.0 - 10.5 K/uL 10.2  9.3  11.5   Hemoglobin  13.0 - 17.0 g/dL 32.4  40.1  02.7   Hematocrit 39.0 - 52.0 % 42.4  44.6  49.8   Platelets 150 - 400 K/uL 154  178  187    External Labs:  ***  Physical Exam:   VS:  There were no vitals taken for this visit.   Wt Readings from Last 3 Encounters:  02/01/23 (!) 358 lb (162.4 kg)  10/16/22 (!) 358 lb (162.4 kg)  04/14/22 (!) 357 lb (161.9 kg)     Physical Exam  Studies Reviewed: .    *** EKG:         EKG 10/16/2022: Normal sinus rhythm at rate of 81 bpm, left axis deviation, left anterior fascicular block.  Right bundle branch block.  Low-voltage complexes.  Pulmonary disease pattern.  Compared to 04/14/2022, no change.   EKG 02/06/2022 and also 02/07/2022: Typical atrial flutter with variable AV conduction with nonspecific T abnormality.  Medications and allergies    No Known Allergies   Current Outpatient Medications:  amLODipine (NORVASC) 10 MG tablet, TAKE 1 TABLET(10 MG) BY MOUTH DAILY, Disp: 30 tablet, Rfl: 0   atorvastatin (LIPITOR) 10 MG tablet, Take 1 tablet by mouth in the morning and 1/2 tablet in the evening, Disp: , Rfl:    flecainide (TAMBOCOR) 50 MG tablet, Take 1 tablet (50 mg total) by mouth 2 (two) times daily., Disp: 180 tablet, Rfl: 3   fluocinonide cream (LIDEX) 0.05 %, Apply 1 application. topically as needed., Disp: , Rfl:    Glucos-MSM-C-Mn-Ginger-Willow (GLUCOSAMINE MSM COMPLEX) TABS, Take 1 tablet by mouth daily., Disp: , Rfl:    ketoconazole (NIZORAL) 2 % cream, Apply 1 application. topically as needed., Disp: , Rfl:    labetalol (NORMODYNE) 300 MG tablet, Take 1 tablet (300 mg total) by mouth 2 (two) times daily., Disp: 60 tablet, Rfl: 2   Magnesium 500 MG CAPS, Take 500 mg by mouth every other day., Disp: , Rfl:    spironolactone (ALDACTONE) 25 MG tablet, TAKE 1 TABLET(25 MG) BY MOUTH EVERY MORNING, Disp: 90 tablet, Rfl: 3   Turmeric-Ginger 150-25 MG CHEW, Chew 1 capsule by mouth daily., Disp: , Rfl:    valsartan-hydrochlorothiazide (DIOVAN-HCT)  320-12.5 MG tablet, Take 1 tablet by mouth daily., Disp: , Rfl:    ASSESSMENT AND PLAN: .      ICD-10-CM   1. Primary hypertension  I10     2. H/O atrial flutter  Z86.79     3. Class 3 severe obesity due to excess calories with serious comorbidity and body mass index (BMI) of 50.0 to 59.9 in adult [facility])  E95.284    Z68.43    E66.01       1. Primary hypertension ***  2. H/O atrial flutter ***  3. Class 3 severe obesity due to excess calories with serious comorbidity and body mass index (BMI) of 50.0 to 59.9 in adult [facility]) ***  Assessment and Plan                {Are you ordering a CV Procedure (e.g. stress test, cath, DCCV, TEE, etc)?   Press F2        :[id number]}   Signed,  Yates Decamp, MD, [facility] 11/25/2023, 9:52 PM [facility] [address] [place], [place] [address] Phone: [phone]. Fax:  [phone]

## 2023-11-27 ENCOUNTER — Ambulatory Visit: Payer: BC Managed Care – PPO | Attending: Cardiology | Admitting: Cardiology

## 2023-11-27 ENCOUNTER — Encounter: Payer: Self-pay | Admitting: Cardiology

## 2023-11-27 VITALS — BP 124/86 | HR 65 | Resp 16 | Ht 70.0 in | Wt 319.8 lb

## 2023-11-27 DIAGNOSIS — I10 Essential (primary) hypertension: Secondary | ICD-10-CM

## 2023-11-27 DIAGNOSIS — Z8679 Personal history of other diseases of the circulatory system: Secondary | ICD-10-CM | POA: Diagnosis not present

## 2023-11-27 DIAGNOSIS — Z6841 Body Mass Index (BMI) 40.0 and over, adult: Secondary | ICD-10-CM

## 2023-11-27 DIAGNOSIS — E66813 Obesity, class 3: Secondary | ICD-10-CM

## 2023-11-27 DIAGNOSIS — I428 Other cardiomyopathies: Secondary | ICD-10-CM

## 2023-11-27 NOTE — Patient Instructions (Signed)
 Medication Instructions:  Your physician recommends that you continue on your current medications as directed. Please refer to the Current Medication list given to you today.  *If you need a refill on your cardiac medications before your next appointment, please call your pharmacy*   Lab Work: none If you have labs (blood work) drawn today and your tests are completely normal, you will receive your results only by: MyChart Message (if you have MyChart) OR A paper copy in the mail If you have any lab test that is abnormal or we need to change your treatment, we will call you to review the results.   Testing/Procedures: none   Follow-Up: At [facility], you and your health needs are our priority.  As part of our continuing mission to provide you with exceptional heart care, we have created designated Provider Care Teams.  These Care Teams include your primary Cardiologist (physician) and Advanced Practice Providers (APPs -  Physician Assistants and Nurse Practitioners) who all work together to provide you with the care you need, when you need it.  We recommend signing up for the patient portal called "MyChart".  Sign up information is provided on this After Visit Summary.  MyChart is used to connect with patients for Virtual Visits (Telemedicine).  Patients are able to view lab/test results, encounter notes, upcoming appointments, etc.  Non-urgent messages can be sent to your provider as well.   To learn more about what you can do with MyChart, go to [email or web address].    Your next appointment:   6 month(s)  Provider:   Yates Decamp, MD     Other Instructions

## 2024-02-28 ENCOUNTER — Ambulatory Visit (HOSPITAL_COMMUNITY): Payer: Medicare Other

## 2024-03-13 ENCOUNTER — Other Ambulatory Visit: Payer: Self-pay | Admitting: Cardiology

## 2024-03-13 DIAGNOSIS — I10 Essential (primary) hypertension: Secondary | ICD-10-CM

## 2024-03-18 ENCOUNTER — Telehealth: Payer: Self-pay | Admitting: Cardiology

## 2024-03-18 DIAGNOSIS — I483 Typical atrial flutter: Secondary | ICD-10-CM

## 2024-03-18 DIAGNOSIS — I10 Essential (primary) hypertension: Secondary | ICD-10-CM

## 2024-03-18 MED ORDER — FLECAINIDE ACETATE 50 MG PO TABS: 50.0000 mg | ORAL_TABLET | Freq: Two times a day (BID) | ORAL | 2 refills | Status: DC

## 2024-03-18 MED ORDER — AMLODIPINE BESYLATE 10 MG PO TABS
10.0000 mg | ORAL_TABLET | Freq: Every day | ORAL | 2 refills | Status: AC
Start: 2024-03-18 — End: ?

## 2024-03-18 MED ORDER — SPIRONOLACTONE 25 MG PO TABS: 25.0000 mg | ORAL_TABLET | Freq: Every morning | ORAL | 2 refills | Status: DC

## 2024-03-18 NOTE — Telephone Encounter (Signed)
 Pt's medications were sent to pt's pharmacy as requested. Confirmation received.

## 2024-03-18 NOTE — Telephone Encounter (Signed)
 Pt c/o medication issue:  1. Name of Medication:   spironolactone (ALDACTONE) 25 MG tablet    2. How are you currently taking this medication (dosage and times per day)? Pt has not been taking  3. Are you having a reaction (difficulty breathing--STAT)? No   4. What is your medication issue? Pt wants to know what this medication is for.

## 2024-03-18 NOTE — Telephone Encounter (Signed)
 Pt's medication was sent to pt's pharmacy as requested. Confirmation received.

## 2024-03-18 NOTE — Telephone Encounter (Signed)
*  STAT* If patient is at the pharmacy, call can be transferred to refill team.   1. Which medications need to be refilled? (please list name of each medication and dose if known) amLODipine (NORVASC) 10 MG tablet   2. Which pharmacy/location (including street and city if local pharmacy) is medication to be sent to? [id number] - [place], [place] - [address] AT [facility] [address] Phone: [phone]  Fax: [phone]     3. Do they need a 30 day or 90 day supply? 90

## 2024-03-18 NOTE — Telephone Encounter (Signed)
*  STAT* If patient is at the pharmacy, call can be transferred to refill team.   1. Which medications need to be refilled? (please list name of each medication and dose if known) flecainide (TAMBOCOR) 50 MG tablet   2. Which pharmacy/location (including street and city if local pharmacy) is medication to be sent to? [id number] - [place], [place] - [address] AT [facility] [address] Phone: [phone]  Fax: [phone]     3. Do they need a 30 day or 90 day supply? 90

## 2024-03-19 ENCOUNTER — Ambulatory Visit (HOSPITAL_COMMUNITY): Attending: Cardiology

## 2024-03-19 DIAGNOSIS — Z8679 Personal history of other diseases of the circulatory system: Secondary | ICD-10-CM | POA: Diagnosis not present

## 2024-03-19 DIAGNOSIS — I428 Other cardiomyopathies: Secondary | ICD-10-CM | POA: Diagnosis not present

## 2024-03-19 LAB — ECHOCARDIOGRAM COMPLETE
Area-P 1/2: 4.21 cm2
S' Lateral: 3.5 cm

## 2024-03-20 ENCOUNTER — Telehealth: Payer: Self-pay | Admitting: *Deleted

## 2024-03-20 NOTE — Telephone Encounter (Signed)
 I spoke with patient and told him Dr Jacinto Halim would like him to resume spironolactone

## 2024-03-20 NOTE — Telephone Encounter (Signed)
 I spoke with patient and reviewed results with him.  Patient aware of appointment with Dr Jacinto Halim on June 10.  Patient reports he has not taken spironolactone since October 2024 as he was not sure he was to continue. Patient is asking if Dr Jacinto Halim would like him to resume spironolactone at this time?

## 2024-03-20 NOTE — Telephone Encounter (Signed)
 Yes please hypertension and leg edema. He was taking it on his last OV

## 2024-03-20 NOTE — Progress Notes (Signed)
 LVEF has normalized from mild reduction since he has maintained sinus rhythm. He has an appointment with me and we can discuss then also

## 2024-03-20 NOTE — Telephone Encounter (Signed)
-----   Message from Yates Decamp sent at 03/20/2024  6:27 AM EDT ----- LVEF has normalized from mild reduction since he has maintained sinus rhythm. He has an appointment with me and we can discuss then also

## 2024-04-14 DIAGNOSIS — Z0001 Encounter for general adult medical examination with abnormal findings: Secondary | ICD-10-CM | POA: Diagnosis not present

## 2024-04-14 DIAGNOSIS — I428 Other cardiomyopathies: Secondary | ICD-10-CM | POA: Diagnosis not present

## 2024-04-14 DIAGNOSIS — R21 Rash and other nonspecific skin eruption: Secondary | ICD-10-CM | POA: Diagnosis not present

## 2024-04-14 DIAGNOSIS — I4892 Unspecified atrial flutter: Secondary | ICD-10-CM | POA: Diagnosis not present

## 2024-04-14 DIAGNOSIS — R600 Localized edema: Secondary | ICD-10-CM | POA: Diagnosis not present

## 2024-04-21 ENCOUNTER — Telehealth: Payer: Self-pay | Admitting: Cardiology

## 2024-04-21 MED ORDER — METOPROLOL TARTRATE 100 MG PO TABS: 100.0000 mg | ORAL_TABLET | Freq: Two times a day (BID) | ORAL | 3 refills | Status: AC

## 2024-04-21 NOTE — Telephone Encounter (Signed)
 Called pt in regards to metoprolol .  Advised that metoprolol  was stopped and pt started on Labetalol  300 mg PO BID. Pt reports does recall ever starting Labetalol .  Dr. Schuyler Custard has been refilling metoprolol  100 mg PO BID.  Pt expresses is currently in process of switching PCP.  Pt reports BP ranges 120's/60-70's HR 45-50 in AM- 70's.  Called Pharmacy was advised pt never picked up Labetalol . Has been picking up metoprolol  100 mg PO BID. Will send to Dr. Ganji to see if pt needs to switch to labetalol .

## 2024-04-21 NOTE — Telephone Encounter (Signed)
 I am good with Rx Metoprolol  Tartrate 100 mg BID 180 x 3 refills

## 2024-04-21 NOTE — Telephone Encounter (Signed)
 Called pt advised of MD response:  I am good with Rx Metoprolol  Tartrate 100 mg BID 180 x 3 refills   Pt had no further questions or concerns.  Script sent to pharmacy of pt choice.

## 2024-04-21 NOTE — Telephone Encounter (Signed)
*  STAT* If patient is at the pharmacy, call can be transferred to refill team.   1. Which medications need to be refilled? (please list name of each medication and dose if known)  Metoprolol  100 MG  2. Which pharmacy/location (including street and city if local pharmacy) is medication to be sent to? [id number] - [place], [place] - [address]  3. Do they need a 30 day or 90 day supply?  30 days

## 2024-04-21 NOTE — Telephone Encounter (Signed)
 Dr. Maida Sciara pt. He is requesting a refill of Metoprolol  100mg . This RX looks like it may have been expired. Is Dr. Berry Bristol ok with refilling this?

## 2024-05-20 DIAGNOSIS — Z87442 Personal history of urinary calculi: Secondary | ICD-10-CM | POA: Diagnosis not present

## 2024-05-20 DIAGNOSIS — R109 Unspecified abdominal pain: Secondary | ICD-10-CM | POA: Diagnosis not present

## 2024-05-20 DIAGNOSIS — Z8042 Family history of malignant neoplasm of prostate: Secondary | ICD-10-CM | POA: Diagnosis not present

## 2024-05-27 ENCOUNTER — Ambulatory Visit: Payer: BC Managed Care – PPO | Admitting: Cardiology

## 2024-06-13 DIAGNOSIS — N200 Calculus of kidney: Secondary | ICD-10-CM | POA: Diagnosis not present

## 2024-07-04 ENCOUNTER — Ambulatory Visit: Attending: Cardiology | Admitting: Cardiology

## 2024-07-04 ENCOUNTER — Encounter: Payer: Self-pay | Admitting: Cardiology

## 2024-07-04 VITALS — BP 114/62 | HR 61 | Ht 70.0 in | Wt 313.0 lb

## 2024-07-04 DIAGNOSIS — Z6841 Body Mass Index (BMI) 40.0 and over, adult: Secondary | ICD-10-CM

## 2024-07-04 DIAGNOSIS — E66813 Obesity, class 3: Secondary | ICD-10-CM

## 2024-07-04 DIAGNOSIS — I10 Essential (primary) hypertension: Secondary | ICD-10-CM | POA: Diagnosis not present

## 2024-07-04 DIAGNOSIS — Z8679 Personal history of other diseases of the circulatory system: Secondary | ICD-10-CM | POA: Diagnosis not present

## 2024-07-04 DIAGNOSIS — R739 Hyperglycemia, unspecified: Secondary | ICD-10-CM | POA: Diagnosis not present

## 2024-07-04 NOTE — Patient Instructions (Signed)
 Medication Instructions:  Stop spironolactone   *If you need a refill on your cardiac medications before your next appointment, please call your pharmacy*  Lab Work: none If you have labs (blood work) drawn today and your tests are completely normal, you will receive your results only by: MyChart Message (if you have MyChart) OR A paper copy in the mail If you have any lab test that is abnormal or we need to change your treatment, we will call you to review the results.  Testing/Procedures: none  Follow-Up: At [facility], you and your health needs are our priority.  As part of our continuing mission to provide you with exceptional heart care, our providers are all part of one team.  This team includes your primary Cardiologist (physician) and Advanced Practice Providers or APPs (Physician Assistants and Nurse Practitioners) who all work together to provide you with the care you need, when you need it.  Your next appointment:   6 month(s)  Provider:   Gordy Bergamo, MD    We recommend signing up for the patient portal called MyChart.  Sign up information is provided on this After Visit Summary.  MyChart is used to connect with patients for Virtual Visits (Telemedicine).  Patients are able to view lab/test results, encounter notes, upcoming appointments, etc.  Non-urgent messages can be sent to your provider as well.   To learn more about what you can do with MyChart, go to [email or web address].   Other Instructions

## 2024-07-04 NOTE — Progress Notes (Signed)
 Cardiology Office Note:  .   Date:  07/05/2024  ID:  Leslie Soto, DOB 19-Mar-1961, MRN [id number] PCP: Anita Bernardino BROCKS, FNP  [facility] Providers Cardiologist:  Gordy Bergamo, MD   History of Present Illness: Leslie Soto is a 63 y.o. Caucasian male patient  with past medical history of morbid obesity, hypertension, obstructive sleep apnea on CPAP therapy and has had recurrent episodes of nephrolithiasis, with history of typical a flutter with RVR and admitted to the hospital on 02/06/2022, underwent cardioversion.  He has maintained sinus rhythm on flecainide .  He has had a low risk Lexiscan  nuclear stress test on 04/12/2020.  Last echocardiogram 03/20/2023 revealing normal LVEF.  He has been evaluated by EP and his CHA2DS2-VASc score is 1.0, Eliquis  discontinued and in view of his excess weight, EP study was not offered unless he loses weight and was started on Flecainide .  He now presents for his 70-month office visit, wants to see me on a 5-month basis, states that he is feeling remarkably well and except for palpitations when he misses the dose of flecainide .  He has not had any further leg edema and since making dietary changes has been losing weight and dyspnea has also improved.  Discussed the use of AI scribe software for clinical note transcription with the patient, who gave verbal consent to proceed.  History of Present Illness Leslie Soto is a 63 year old male with atrial flutter and obesity who presents for cardiovascular follow-up. He experiences heart palpitations, possibly due to missing a dose of his medication. He is on flecainide  50 mg twice daily for atrial flutter and metoprolol  tartrate 100 mg twice daily.  His leg swelling has improved and is no longer present.  He has transitioned to a vegetarian diet and is focused on weight loss and has been steadily losing weight with every visit and intends to continue his diet.  He is concerned about his blood sugar levels,  with a previous A1c of 6% in June 2024, indicating hyperglycemia.  Labs   Lab Results  Component Value Date   NA 139 10/26/2022   K 4.4 10/26/2022   CO2 17 (L) 10/26/2022   GLUCOSE 185 (H) 10/26/2022   BUN 11 10/26/2022   CREATININE 0.81 10/26/2022   CALCIUM  8.7 10/26/2022   EGFR 99 10/26/2022   GFRNONAA >60 02/09/2022      Latest Ref Rng & Units 10/26/2022   10:55 AM 02/09/2022    6:24 AM 02/07/2022    6:37 AM  BMP  Glucose 70 - 99 mg/dL 814  888  889   BUN 8 - 27 mg/dL 11  17  10    Creatinine 0.76 - 1.27 mg/dL 9.18  8.89  9.14   BUN/Creat Ratio 10 - 24 14     Sodium 134 - 144 mmol/L 139  137  138   Potassium 3.5 - 5.2 mmol/L 4.4  4.3  3.8   Chloride 96 - 106 mmol/L 107  109  109   CO2 20 - 29 mmol/L 17  22  21    Calcium  8.6 - 10.2 mg/dL 8.7  8.9  8.7       Latest Ref Rng & Units 02/09/2022    6:24 AM 02/06/2022   12:10 PM 11/05/2021    7:42 AM  CBC  WBC 4.0 - 10.5 K/uL 10.2  9.3  11.5   Hemoglobin 13.0 - 17.0 g/dL 85.7  84.6  83.0   Hematocrit 39.0 -  52.0 % 42.4  44.6  49.8   Platelets 150 - 400 K/uL 154  178  187    No results found for: HGBA1C  Lab Results  Component Value Date   TSH 1.090 02/06/2022     ROS  Review of Systems  Cardiovascular:  Negative for chest pain, dyspnea on exertion and leg swelling.   Physical Exam:   VS:  BP 114/62 (BP Location: Left Arm, Patient Position: Sitting)   Pulse 61   Ht 5' 10 (1.778 m)   Wt (!) 313 lb (142 kg)   SpO2 95%   BMI 44.91 kg/m    Wt Readings from Last 3 Encounters:  07/04/24 (!) 313 lb (142 kg)  11/27/23 (!) 319 lb 12.8 oz (145.1 kg)  02/01/23 (!) 358 lb (162.4 kg)    Physical Exam Constitutional:      Appearance: He is morbidly obese.  Neck:     Vascular: No carotid bruit or JVD.  Cardiovascular:     Rate and Rhythm: Normal rate and regular rhythm.     Pulses: Intact distal pulses.     Heart sounds: Normal heart sounds. No murmur heard.    No gallop.  Pulmonary:     Effort: Pulmonary effort  is normal.     Breath sounds: Normal breath sounds.  Abdominal:     General: Bowel sounds are normal.     Palpations: Abdomen is soft.  Musculoskeletal:     Right lower leg: No edema.     Left lower leg: No edema.    Studies Reviewed: SABRA    ECHOCARDIOGRAM COMPLETE 03/19/2024  1. Left ventricular ejection fraction, by estimation, is 60 to 65%. The left ventricle has normal function. The left ventricle has no regional wall motion abnormalities. There is mild concentric left ventricular hypertrophy. Left ventricular diastolic parameters are indeterminate. 2. Right ventricular systolic function is normal. The right ventricular size is normal. 3. Left atrial size was mildly dilated. 4. The mitral valve is normal in structure. Trivial mitral valve regurgitation. No evidence of mitral stenosis. 5. The aortic valve is tricuspid. There is mild calcification of the aortic valve. Aortic valve regurgitation is not visualized. Aortic valve sclerosis/calcification is present, without any evidence of aortic stenosis. 6. Aortic dilatation noted. There is mild dilatation of the ascending aorta, measuring 40 mm. 7. The inferior vena cava is normal in size with greater than 50% respiratory variability, suggesting right atrial pressure of 3 mmHg.  EKG:    EKG Interpretation Date/Time:  Friday July 04 2024 09:32:37 EDT Ventricular Rate:  61 PR Interval:  194 QRS Duration:  146 QT Interval:  450 QTC Calculation: 453 R Axis:   -39  Text Interpretation: EKG 07/04/2024: Normal sinus rhythm at rate of 61 bpm, left anterior fascicular block.  Right bundle branch block.  Bifascicular block.  Compared to 11/27/2023, no significant change. Confirmed by Sameer Teeple, Jagadeesh ([id number]) on 07/04/2024 9:44:41 AM    Medications ordered    No orders of the defined types were placed in this encounter.    ASSESSMENT AND PLAN: .      ICD-10-CM   1. Primary hypertension  I10 EKG 12-Lead    2. H/O atrial flutter  Z86.79 EKG  12-Lead    3. Hyperglycemia  R73.9     4. Class 3 severe obesity due to excess calories with serious comorbidity and body mass index (BMI) of 40.0 to 44.9 in adult  E66.813 EKG 12-Lead   Z68.41  Assessment & Plan Atrial flutter Atrial flutter is well-managed with flecainide  50 mg twice daily and metoprolol  tartrate 100 mg twice daily. Heart function is normal per echocardiogram in April 2025. No recent episodes of irregular heart rhythms, though a brief episode of palpitations possibly due to missed medication. Ablation not recommended unless symptoms recur. - Continue flecainide  50 mg twice daily - Continue metoprolol  tartrate 100 mg twice daily - Order EKG today - Consider ablation if atrial flutter recurs - Not on anticoagulation in view of low chadsvasc risk score of 1  Chronic diastolic Heart failure with recovered EF after he presented to ED with AFL with RVR.  Heart failure is well-managed with no current symptoms such as leg swelling or shortness of breath. Heart function is normal per recent echocardiogram, previously mildly depressed when he presented with AFL with RVRin Feb 2023. Current medication regimen is effective. - Continue current heart failure management regimen  Hypertension Hypertension is well-controlled with current medication regimen. Blood pressure remains stable. Medications include amlodipine  10 mg once daily, metoprolol  tartrate 100 mg twice daily, spironolactone  25 mg once daily, and valsartan HCT 320/12.5 mg once daily. - Continue amlodipine  10 mg once daily - Continue metoprolol  tartrate 100 mg twice daily - Continue spironolactone  25 mg once daily - Continue valsartan HCT 320/12.5 mg once daily  Obesity Obesity is being addressed through lifestyle modifications, including a vegetarian diet and weight loss efforts. He has lost 6 to 8 pounds since the last visit, with a total weight of 313 pounds. Encouraged to continue weight loss and healthy eating  habits, especially when moving in with his brother. - Encourage further weight loss and healthy eating habits  Hyperglycemia Hyperglycemia is present with an A1c of 6% as of June 15, 2023. He is not diagnosed with diabetes but is at risk. Prefers natural alternatives and dietary management over medication. - Monitor blood glucose levels - Encourage dietary management to control blood sugar - Follow up with primary care provider for routine labs - OV in 6 months   Signed,  Gordy Bergamo, MD, [facility] 07/05/2024, 8:04 AM [facility] [address] [place], [place] [address] Phone: [phone]. Fax:  [phone]

## 2024-09-24 ENCOUNTER — Telehealth: Payer: Self-pay | Admitting: Cardiology

## 2024-09-24 MED ORDER — ATORVASTATIN CALCIUM 10 MG PO TABS: 10.0000 mg | ORAL_TABLET | Freq: Every day | ORAL | 2 refills | Status: AC

## 2024-09-24 NOTE — Telephone Encounter (Signed)
 1. Which medications need to be refilled? (please list name of each medication and dose if known) atorvastatin  (LIPITOR) 10 MG tablet    2. Would you like to learn more about the convenience, safety, & potential cost savings by using the [facility]? No     3. Are you open to using the [facility] (Type [facility]. No    4. Which pharmacy/location (including street and city if local pharmacy) is medication to be sent to? [id number] - [place], [place] - [address]     5. Do they need a 30 day or 90 day supply? 90 day

## 2024-09-24 NOTE — Telephone Encounter (Signed)
 Pt c/o medication issue:  1. Name of Medication: spironolactone  (ALDACTONE ) 25 MG tablet [[id number]]  DISCONTINUED   2. How are you currently taking this medication (dosage and times per day)?  Take 1 tablet (25 mg total) by mouth every morning.          3. Are you having a reaction (difficulty breathing--STAT)? No  4. What is your medication issue? [employer] reached out to patient asking why he was not taking this medication, and advised he continue. Pt was told by office to discontinue, and he is calling to verify and sort out conflicting information. Please advise.     *STAT* If patient is at the pharmacy, call can be transferred to refill team.

## 2024-09-24 NOTE — Telephone Encounter (Signed)
 Returned patient's call. Patient states he is getting calls from [facility] asking why he's not refilling his spironolactone . Patient states it was discontinued at office visit on 07/04/24. Office notes state that at that time his leg swelling was improved.  Today patient reports that he has been using the spironolactone  on an as needed basis. In fact, this past week he used one dose of 25 mg spironolactone  a day for 3 days to resolve some minor leg swelling. He denies any SOB and gives HR as 61 and BP as 137/66 at the time of this call. He reports he has gained some weight and is now at 320 lbs. Patient states she would like to keep spironolactone  as a med on an as needed basis. Forwarded patient responses to Dr. Ladona.

## 2024-09-24 NOTE — Telephone Encounter (Signed)
 Yes please keep PRN

## 2024-09-24 NOTE — Telephone Encounter (Signed)
 Pt's medication was sent to pt's pharmacy as requested. Confirmation received.

## 2024-09-25 MED ORDER — SPIRONOLACTONE 25 MG PO TABS: 25.0000 mg | ORAL_TABLET | Freq: Every day | ORAL | 3 refills | Status: AC

## 2024-09-25 NOTE — Telephone Encounter (Signed)
 Call to patient to advise that per Dr. Ladona a refill of spironolactone  was sent to his pharmacy of choice. Reviewed that Dr. Ganji recommends he continue to use this medication PRN.

## 2024-09-25 NOTE — Addendum Note (Signed)
 Addended by: JANIT GENI CROME on: 09/25/2024 04:21 PM   Modules accepted: Orders

## 2025-01-01 ENCOUNTER — Other Ambulatory Visit: Payer: Self-pay | Admitting: Cardiology

## 2025-01-01 DIAGNOSIS — I483 Typical atrial flutter: Secondary | ICD-10-CM

## 2025-01-02 NOTE — Telephone Encounter (Signed)
 Labs done on 07/11/24. Last ECG on 07/04/24.   In accordance with refill protocols, please review and address the following requirements before this medication refill can be authorized:  EKG

## 2025-01-30 ENCOUNTER — Ambulatory Visit: Admitting: Cardiology
